# Patient Record
Sex: Female | Born: 1985 | Race: White | Hispanic: No | Marital: Single | Smoking: Never smoker
Health system: Southern US, Community
[De-identification: ages and names within clinical notes are randomized; demographics above are authoritative.]

## PROBLEM LIST (undated history)

## (undated) DIAGNOSIS — F419 Anxiety disorder, unspecified: Secondary | ICD-10-CM

## (undated) DIAGNOSIS — Z87442 Personal history of urinary calculi: Secondary | ICD-10-CM

## (undated) DIAGNOSIS — F411 Generalized anxiety disorder: Secondary | ICD-10-CM

## (undated) DIAGNOSIS — F329 Major depressive disorder, single episode, unspecified: Secondary | ICD-10-CM

## (undated) DIAGNOSIS — R7303 Prediabetes: Secondary | ICD-10-CM

## (undated) DIAGNOSIS — K5900 Constipation, unspecified: Secondary | ICD-10-CM

## (undated) DIAGNOSIS — K219 Gastro-esophageal reflux disease without esophagitis: Secondary | ICD-10-CM

## (undated) DIAGNOSIS — R9389 Abnormal findings on diagnostic imaging of other specified body structures: Secondary | ICD-10-CM

## (undated) DIAGNOSIS — D649 Anemia, unspecified: Secondary | ICD-10-CM

## (undated) DIAGNOSIS — N921 Excessive and frequent menstruation with irregular cycle: Secondary | ICD-10-CM

## (undated) DIAGNOSIS — N939 Abnormal uterine and vaginal bleeding, unspecified: Secondary | ICD-10-CM

## (undated) DIAGNOSIS — N201 Calculus of ureter: Secondary | ICD-10-CM

## (undated) DIAGNOSIS — F32A Depression, unspecified: Secondary | ICD-10-CM

## (undated) HISTORY — PX: WISDOM TOOTH EXTRACTION: SHX21

## (undated) HISTORY — DX: Abnormal uterine and vaginal bleeding, unspecified: N93.9

---
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
## 2010-06-29 ENCOUNTER — Emergency Department (HOSPITAL_COMMUNITY): Admission: EM | Admit: 2010-06-29 | Discharge: 2010-06-30 | Payer: Self-pay | Admitting: Emergency Medicine

## 2010-11-05 LAB — URINALYSIS, ROUTINE W REFLEX MICROSCOPIC
Ketones, ur: NEGATIVE mg/dL
Leukocytes, UA: NEGATIVE
Protein, ur: NEGATIVE mg/dL
Urobilinogen, UA: 0.2 mg/dL (ref 0.0–1.0)

## 2010-11-05 LAB — URINE MICROSCOPIC-ADD ON

## 2011-07-04 DIAGNOSIS — N926 Irregular menstruation, unspecified: Secondary | ICD-10-CM | POA: Insufficient documentation

## 2013-02-05 ENCOUNTER — Emergency Department (HOSPITAL_BASED_OUTPATIENT_CLINIC_OR_DEPARTMENT_OTHER)
Admission: EM | Admit: 2013-02-05 | Discharge: 2013-02-05 | Disposition: A | Discharge status: Home / Self Care | Payer: BC Managed Care – PPO | Attending: Emergency Medicine | Admitting: Emergency Medicine

## 2013-02-05 ENCOUNTER — Encounter (HOSPITAL_BASED_OUTPATIENT_CLINIC_OR_DEPARTMENT_OTHER): Payer: Self-pay | Admitting: *Deleted

## 2013-02-05 DIAGNOSIS — B379 Candidiasis, unspecified: Secondary | ICD-10-CM | POA: Insufficient documentation

## 2013-02-05 DIAGNOSIS — Z87448 Personal history of other diseases of urinary system: Secondary | ICD-10-CM | POA: Insufficient documentation

## 2013-02-05 DIAGNOSIS — B372 Candidiasis of skin and nail: Secondary | ICD-10-CM

## 2013-02-05 DIAGNOSIS — Z880 Allergy status to penicillin: Secondary | ICD-10-CM | POA: Insufficient documentation

## 2013-02-05 MED ORDER — NYSTATIN-TRIAMCINOLONE 100000-0.1 UNIT/GM-% EX OINT: TOPICAL_OINTMENT | Freq: Two times a day (BID) | CUTANEOUS | Status: DC

## 2013-02-05 NOTE — ED Notes (Signed)
Pt states the area above her naval was sore and began draining Thursday. She thought it may be a yeast infection and cleaned and tx'd it for same. Today began bleeding.

## 2013-02-05 NOTE — ED Provider Notes (Signed)
History     CSN: [id number]  Arrival date & time 02/05/13  1702   First MD Initiated Contact with Patient 02/05/13 1748      Chief Complaint  Patient presents with  . Wound Check    (Consider location/radiation/quality/duration/timing/severity/associated sxs/prior treatment) HPI Comments: Patient is a 27 year old female who presents with naval drainage for the past 2 days. Patient reports gradual onset of symptoms and progressive worsening. Patient reports white drainage from her naval yesterday and today she noticed some blood. Yesterday she cleaned the area but feels as though her symptoms are worsening. No associated symptoms. Palpation of the area is irritating. No alleviating factors.    Past Medical History  Diagnosis Date  . Renal disorder     History reviewed. No pertinent past surgical history.  History reviewed. No pertinent family history.  History  Substance Use Topics  . Smoking status: Never Smoker   . Smokeless tobacco: Not on file  . Alcohol Use: No    OB History   Grav Para Term Preterm Abortions TAB SAB Ect Mult Living                  Review of Systems  Skin: Positive for wound.  All other systems reviewed and are negative.    Allergies  Amoxicillin and Sulfa antibiotics  Home Medications   Current Outpatient Rx  Name  Route  Sig  Dispense  Refill  . phentermine 15 MG capsule   Oral   Take 15 mg by mouth every morning.           BP 120/76  Pulse 74  Temp(Src) 98.1 F (36.7 C) (Oral)  Resp 18  Ht 5\' 1"  (1.549 m)  Wt 185 lb (83.915 kg)  BMI 34.97 kg/m2  SpO2 100%  LMP 01/15/2013  Physical Exam  Nursing note and vitals reviewed. Constitutional: She appears well-developed and well-nourished. No distress.  HENT:  Head: Normocephalic and atraumatic.  Eyes: Conjunctivae are normal.  Neck: Normal range of motion.  Cardiovascular: Normal rate and regular rhythm.  Exam reveals no gallop and no friction rub.   No murmur  heard. Pulmonary/Chest: Effort normal and breath sounds normal. She has no wheezes. She has no rales. She exhibits no tenderness.  Abdominal: Soft. She exhibits no distension. There is no tenderness. There is no rebound and no guarding.  Musculoskeletal: Normal range of motion.  Neurological: She is alert.  Speech is goal-oriented. Moves limbs without ataxia.   Skin: Skin is warm and dry.  Erythematous and raw appearing naval area. White discharge noted in [employer].   Psychiatric: She has a normal mood and affect. Her behavior is normal.    ED Course  Procedures (including critical care time)  Labs Reviewed - No data to display No results found.   1. Yeast infection of the skin       MDM  6:24 PM Patient will have topical fluconazole for yeast infection. Patient instructed to keep area dry and clean. Patient will follow up with PCP. Patient instructed to return to the ED with worsening or concerning symptoms.        Emilia Beck, PA-C 02/06/13 1740

## 2013-02-06 NOTE — ED Provider Notes (Signed)
Medical screening examination/treatment/procedure(s) were performed by non-physician practitioner and as supervising physician I was immediately available for consultation/collaboration.   Karri Kallenbach B. Bernette Mayers, MD 02/06/13 [id number]

## 2013-04-25 ENCOUNTER — Emergency Department (HOSPITAL_COMMUNITY)
Admission: EM | Admit: 2013-04-25 | Discharge: 2013-04-25 | Disposition: A | Discharge status: Home / Self Care | Payer: BC Managed Care – PPO | Attending: Emergency Medicine | Admitting: Emergency Medicine

## 2013-04-25 ENCOUNTER — Encounter (HOSPITAL_COMMUNITY): Payer: Self-pay | Admitting: Emergency Medicine

## 2013-04-25 DIAGNOSIS — R110 Nausea: Secondary | ICD-10-CM | POA: Insufficient documentation

## 2013-04-25 DIAGNOSIS — Z79899 Other long term (current) drug therapy: Secondary | ICD-10-CM | POA: Insufficient documentation

## 2013-04-25 DIAGNOSIS — Z880 Allergy status to penicillin: Secondary | ICD-10-CM | POA: Insufficient documentation

## 2013-04-25 DIAGNOSIS — N23 Unspecified renal colic: Secondary | ICD-10-CM

## 2013-04-25 LAB — URINE MICROSCOPIC-ADD ON

## 2013-04-25 LAB — CBC WITH DIFFERENTIAL/PLATELET
Basophils Absolute: 0 10*3/uL (ref 0.0–0.1)
Basophils Relative: 0 % (ref 0–1)
Eosinophils Relative: 1 % (ref 0–5)
HCT: 42.7 % (ref 36.0–46.0)
Lymphocytes Relative: 13 % (ref 12–46)
MCHC: 33.7 g/dL (ref 30.0–36.0)
MCV: 93.6 fL (ref 78.0–100.0)
Monocytes Absolute: 1.2 10*3/uL — ABNORMAL HIGH (ref 0.1–1.0)
Platelets: 387 10*3/uL (ref 150–400)
RDW: 13.3 % (ref 11.5–15.5)
WBC: 12.9 10*3/uL — ABNORMAL HIGH (ref 4.0–10.5)

## 2013-04-25 LAB — URINALYSIS, ROUTINE W REFLEX MICROSCOPIC
Bilirubin Urine: NEGATIVE
Glucose, UA: NEGATIVE mg/dL
Ketones, ur: NEGATIVE mg/dL
Specific Gravity, Urine: 1.021 (ref 1.005–1.030)
pH: 6 (ref 5.0–8.0)

## 2013-04-25 LAB — BASIC METABOLIC PANEL
CO2: 28 mEq/L (ref 19–32)
Calcium: 9.6 mg/dL (ref 8.4–10.5)
Creatinine, Ser: 1.08 mg/dL (ref 0.50–1.10)
GFR calc Af Amer: 82 mL/min — ABNORMAL LOW (ref >90)
GFR calc non Af Amer: 71 mL/min — ABNORMAL LOW (ref >90)
Sodium: 137 mEq/L (ref 135–145)

## 2013-04-25 LAB — HCG, SERUM, QUALITATIVE: Preg, Serum: NEGATIVE

## 2013-04-25 MED ORDER — HYDROMORPHONE HCL PF 1 MG/ML IJ SOLN: 1.0000 mg | INTRAMUSCULAR | Status: DC | PRN

## 2013-04-25 MED ORDER — METRONIDAZOLE 500 MG PO TABS
500.0000 mg | ORAL_TABLET | Freq: Once | ORAL | Status: AC
  Administered 2013-04-25: 500 mg via ORAL
  Filled 2013-04-25: qty 1

## 2013-04-25 MED ORDER — OXYCODONE-ACETAMINOPHEN 5-325 MG PO TABS: 2.0000 | ORAL_TABLET | ORAL | Status: DC | PRN

## 2013-04-25 MED ORDER — SODIUM CHLORIDE 0.9 % IV BOLUS (SEPSIS)
1000.0000 mL | Freq: Once | INTRAVENOUS | Status: AC
  Administered 2013-04-25: 1000 mL via INTRAVENOUS

## 2013-04-25 MED ORDER — ONDANSETRON HCL 4 MG/2ML IJ SOLN
4.0000 mg | Freq: Once | INTRAMUSCULAR | Status: AC
  Administered 2013-04-25: 4 mg via INTRAVENOUS
  Filled 2013-04-25: qty 2

## 2013-04-25 MED ORDER — KETOROLAC TROMETHAMINE 30 MG/ML IJ SOLN
30.0000 mg | Freq: Once | INTRAMUSCULAR | Status: AC
  Administered 2013-04-25: 30 mg via INTRAVENOUS
  Filled 2013-04-25: qty 1

## 2013-04-25 NOTE — ED Notes (Signed)
Pt complains of left flank pain x 1 week. Pt reports to being seen and diagnosed with a kidney stone and :I'm suppose to schedule surgery" Pt reports that the pain and nausea are just "to bad to wait"

## 2013-04-25 NOTE — ED Notes (Deleted)
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 Pt denies pain or discomfort at this time.

## 2013-04-25 NOTE — ED Provider Notes (Signed)
CSN: [id number]     Arrival date & time 04/25/13  1139 History   First MD Initiated Contact with Patient 04/25/13 1155     Chief Complaint  Patient presents with  . Flank Pain    HPI   Patient has a history of kidney stones. Follows with urology, Dr. Marlou Porch.  Had a CT on Friday I tried imaging. She states that Dr. Marlou Porch was over it with her. She describes to me a proximal left 5 mm stone. She states that they discussed possibly doing surgery on Friday, 4 days from now.  She is here today stating that she feels the pain medicine is not controlled her pain well, and wonders if her surgery could be done sooner.  Nausea but no vomiting. No fever. Still making urine. Past Medical History  Diagnosis Date  . Renal disorder    History reviewed. No pertinent past surgical history. History reviewed. No pertinent family history. History  Substance Use Topics  . Smoking status: Never Smoker   . Smokeless tobacco: Not on file  . Alcohol Use: Yes   OB History   Grav Para Term Preterm Abortions TAB SAB Ect Mult Living                 Review of Systems  Constitutional: Negative for fever and chills.  Respiratory: Negative for cough and shortness of breath.   Cardiovascular: Negative for chest pain.  Gastrointestinal: Positive for nausea and abdominal pain. Negative for vomiting and diarrhea.  Endocrine: Negative for polydipsia, polyphagia and polyuria.  Genitourinary: Positive for dysuria and flank pain. Negative for frequency, hematuria and difficulty urinating.  Musculoskeletal: Negative for gait problem.  Skin: Negative for color change, pallor and rash.  Neurological: Negative for dizziness and light-headedness.    Allergies  Amoxicillin and Sulfa antibiotics  Home Medications   Current Outpatient Rx  Name  Route  Sig  Dispense  Refill  . ibuprofen (ADVIL,MOTRIN) 200 MG tablet   Oral   Take 800 mg by mouth every 6 (six) hours as needed for pain.         Marland Kitchen  oxyCODONE-acetaminophen (PERCOCET/ROXICET) 5-325 MG per tablet   Oral   Take 1 tablet by mouth every 6 (six) hours as needed for pain.         . phentermine 37.5 MG capsule   Oral   Take 37.5 mg by mouth every morning.         . promethazine (PHENERGAN) 25 MG tablet   Oral   Take 25-50 mg by mouth every 4 (four) hours as needed for nausea.         . tamsulosin (FLOMAX) 0.4 MG CAPS capsule   Oral   Take 0.4 mg by mouth every morning.         Marland Kitchen oxyCODONE-acetaminophen (PERCOCET/ROXICET) 5-325 MG per tablet   Oral   Take 2 tablets by mouth every 4 (four) hours as needed for pain.   20 tablet   0    BP 116/72  Pulse 89  Temp(Src) 98.4 F (36.9 C) (Oral)  Resp 16  SpO2 97% Physical Exam  Constitutional: She is oriented to person, place, and time. She appears well-developed and well-nourished. No distress.  Sitting upright. Speaking in full senses. She is not writhing  around. Rates her pain a 4-5/10.  HENT:  Head: Normocephalic.  Eyes: Conjunctivae are normal. Pupils are equal, round, and reactive to light. No scleral icterus.  Neck: Normal range of motion. Neck supple. No  thyromegaly present.  Cardiovascular: Normal rate and regular rhythm.  Exam reveals no gallop and no friction rub.   No murmur heard. Pulmonary/Chest: Effort normal and breath sounds normal. No respiratory distress. She has no wheezes. She has no rales.  Abdominal: Soft. Bowel sounds are normal. She exhibits no distension. There is no tenderness. There is no rebound.  Benign abdomen. Nontender.  Genitourinary:  Left flank pain. Not reproducible.  Musculoskeletal: Normal range of motion.  Neurological: She is alert and oriented to person, place, and time.  Skin: Skin is warm and dry. No rash noted.  Psychiatric: She has a normal mood and affect. Her behavior is normal.    ED Course  Procedures (including critical care time) Labs Review Labs Reviewed  URINALYSIS, ROUTINE W REFLEX MICROSCOPIC -  Abnormal; Notable for the following:    APPearance CLOUDY (*)    Hgb urine dipstick MODERATE (*)    Leukocytes, UA SMALL (*)    All other components within normal limits  CBC WITH DIFFERENTIAL - Abnormal; Notable for the following:    WBC 12.9 (*)    Neutrophils Relative % 78 (*)    Neutro Abs 10.0 (*)    Monocytes Absolute 1.2 (*)    All other components within normal limits  BASIC METABOLIC PANEL - Abnormal; Notable for the following:    GFR calc non Af Amer 71 (*)    GFR calc Af Amer 82 (*)    All other components within normal limits  URINE CULTURE  HCG, SERUM, QUALITATIVE  URINE MICROSCOPIC-ADD ON   Imaging Review No results found.  MDM   1. Ureteral colic    Dr. Renal function is preserved. She is tolerating this well. The pain is gone". She has an appointment to see her physician on Friday she is discharged    Claudean Kinds, MD 04/25/13 1415

## 2013-04-26 ENCOUNTER — Other Ambulatory Visit: Payer: Self-pay | Admitting: Urology

## 2013-04-26 LAB — URINE CULTURE
Colony Count: NO GROWTH
Culture: NO GROWTH

## 2013-04-28 ENCOUNTER — Encounter (HOSPITAL_BASED_OUTPATIENT_CLINIC_OR_DEPARTMENT_OTHER): Payer: Self-pay | Admitting: *Deleted

## 2013-04-28 NOTE — Progress Notes (Signed)
NPO AFTER MN. ARRIVES AT 1015. CURRENT LAB RESULTS IN EPIC AND CHART. WILL TAKE PRILOSEC AND IF NEEDED OXYCODONE/ PHENERGAN AM OF SURG W/ SIPS OF WATER.

## 2013-04-29 ENCOUNTER — Ambulatory Visit (HOSPITAL_BASED_OUTPATIENT_CLINIC_OR_DEPARTMENT_OTHER)
Admission: RE | Admit: 2013-04-29 | Discharge: 2013-04-29 | Disposition: A | Discharge status: Home / Self Care | Payer: BC Managed Care – PPO | Source: Ambulatory Visit | Attending: Urology | Admitting: Urology

## 2013-04-29 ENCOUNTER — Ambulatory Visit (HOSPITAL_COMMUNITY): Payer: BC Managed Care – PPO

## 2013-04-29 ENCOUNTER — Encounter (HOSPITAL_BASED_OUTPATIENT_CLINIC_OR_DEPARTMENT_OTHER): Payer: Self-pay | Admitting: Anesthesiology

## 2013-04-29 ENCOUNTER — Encounter (HOSPITAL_BASED_OUTPATIENT_CLINIC_OR_DEPARTMENT_OTHER)
Admission: RE | Disposition: A | Discharge status: Home / Self Care | Payer: Self-pay | Source: Ambulatory Visit | Attending: Urology

## 2013-04-29 ENCOUNTER — Ambulatory Visit (HOSPITAL_BASED_OUTPATIENT_CLINIC_OR_DEPARTMENT_OTHER): Payer: BC Managed Care – PPO | Admitting: Anesthesiology

## 2013-04-29 DIAGNOSIS — N200 Calculus of kidney: Secondary | ICD-10-CM | POA: Insufficient documentation

## 2013-04-29 DIAGNOSIS — K219 Gastro-esophageal reflux disease without esophagitis: Secondary | ICD-10-CM | POA: Insufficient documentation

## 2013-04-29 DIAGNOSIS — N201 Calculus of ureter: Secondary | ICD-10-CM | POA: Insufficient documentation

## 2013-04-29 DIAGNOSIS — Z79899 Other long term (current) drug therapy: Secondary | ICD-10-CM | POA: Insufficient documentation

## 2013-04-29 DIAGNOSIS — E669 Obesity, unspecified: Secondary | ICD-10-CM | POA: Insufficient documentation

## 2013-04-29 HISTORY — DX: Constipation, unspecified: K59.00

## 2013-04-29 HISTORY — DX: Gastro-esophageal reflux disease without esophagitis: K21.9

## 2013-04-29 HISTORY — PX: CYSTOSCOPY WITH RETROGRADE PYELOGRAM, URETEROSCOPY AND STENT PLACEMENT: SHX5789

## 2013-04-29 HISTORY — DX: Calculus of ureter: N20.1

## 2013-04-29 HISTORY — DX: Personal history of urinary calculi: Z87.442

## 2013-04-29 SURGERY — CYSTOURETEROSCOPY, WITH RETROGRADE PYELOGRAM AND STENT INSERTION
Anesthesia: General | Site: Ureter | Laterality: Left | Wound class: Clean Contaminated

## 2013-04-29 MED ORDER — IOHEXOL 350 MG/ML SOLN
INTRAVENOUS | Status: DC | PRN
  Administered 2013-04-29: 7 mL

## 2013-04-29 MED ORDER — LIDOCAINE HCL (CARDIAC) 20 MG/ML IV SOLN
INTRAVENOUS | Status: DC | PRN
  Administered 2013-04-29: 100 mg via INTRAVENOUS

## 2013-04-29 MED ORDER — OXYCODONE HCL 5 MG PO TABS: 5.0000 mg | ORAL_TABLET | ORAL | Status: DC | PRN

## 2013-04-29 MED ORDER — LIDOCAINE HCL 2 % EX GEL
CUTANEOUS | Status: DC | PRN
  Administered 2013-04-29: 1 via URETHRAL

## 2013-04-29 MED ORDER — SODIUM CHLORIDE 0.9 % IR SOLN
Status: DC | PRN
  Administered 2013-04-29: 6000 mL

## 2013-04-29 MED ORDER — KETOROLAC TROMETHAMINE 30 MG/ML IJ SOLN
INTRAMUSCULAR | Status: DC | PRN
  Administered 2013-04-29: 30 mg via INTRAVENOUS

## 2013-04-29 MED ORDER — FENTANYL CITRATE 0.05 MG/ML IJ SOLN
25.0000 ug | INTRAMUSCULAR | Status: DC | PRN
  Filled 2013-04-29: qty 1

## 2013-04-29 MED ORDER — TROSPIUM CHLORIDE 20 MG PO TABS: 20.0000 mg | ORAL_TABLET | Freq: Two times a day (BID) | ORAL | Status: DC

## 2013-04-29 MED ORDER — DEXAMETHASONE SODIUM PHOSPHATE 4 MG/ML IJ SOLN
INTRAMUSCULAR | Status: DC | PRN
  Administered 2013-04-29: 10 mg via INTRAVENOUS

## 2013-04-29 MED ORDER — ONDANSETRON HCL 4 MG/2ML IJ SOLN
INTRAMUSCULAR | Status: DC | PRN
  Administered 2013-04-29: 4 mg via INTRAVENOUS

## 2013-04-29 MED ORDER — BELLADONNA ALKALOIDS-OPIUM 16.2-60 MG RE SUPP
RECTAL | Status: DC | PRN
  Administered 2013-04-29: 1 via RECTAL

## 2013-04-29 MED ORDER — FENTANYL CITRATE 0.05 MG/ML IJ SOLN
INTRAMUSCULAR | Status: DC | PRN
  Administered 2013-04-29 (×6): 50 ug via INTRAVENOUS

## 2013-04-29 MED ORDER — LACTATED RINGERS IV SOLN
INTRAVENOUS | Status: DC
  Administered 2013-04-29 (×2): via INTRAVENOUS
  Filled 2013-04-29: qty 1000

## 2013-04-29 MED ORDER — MIDAZOLAM HCL 5 MG/5ML IJ SOLN: INTRAMUSCULAR | Status: DC | PRN

## 2013-04-29 MED ORDER — CIPROFLOXACIN IN D5W 400 MG/200ML IV SOLN
400.0000 mg | INTRAVENOUS | Status: AC
  Administered 2013-04-29: 400 mg via INTRAVENOUS
  Filled 2013-04-29: qty 200

## 2013-04-29 MED ORDER — PROPOFOL 10 MG/ML IV BOLUS
INTRAVENOUS | Status: DC | PRN
  Administered 2013-04-29: 200 mg via INTRAVENOUS

## 2013-04-29 MED ORDER — LACTATED RINGERS IV SOLN
INTRAVENOUS | Status: DC
  Filled 2013-04-29: qty 1000

## 2013-04-29 MED ORDER — METOCLOPRAMIDE HCL 5 MG/ML IJ SOLN
INTRAMUSCULAR | Status: DC | PRN
  Administered 2013-04-29: 10 mg via INTRAVENOUS

## 2013-04-29 MED ORDER — MIDAZOLAM HCL 5 MG/5ML IJ SOLN
INTRAMUSCULAR | Status: DC | PRN
  Administered 2013-04-29: 2 mg via INTRAVENOUS

## 2013-04-29 SURGICAL SUPPLY — 36 items
ADAPTER CATH URET PLST 4-6FR (CATHETERS) IMPLANT
BAG DRAIN URO-CYSTO SKYTR STRL (DRAIN) ×3 IMPLANT
BASKET LASER NITINOL 1.9FR (BASKET) IMPLANT
BASKET STNLS GEMINI 4WIRE 3FR (BASKET) IMPLANT
BASKET ZERO TIP NITINOL 2.4FR (BASKET) IMPLANT
CANISTER SUCT LVC 12 LTR MEDI- (MISCELLANEOUS) ×3 IMPLANT
CATH INTERMIT  6FR 70CM (CATHETERS) IMPLANT
CATH URET 5FR 28IN CONE TIP (BALLOONS)
CATH URET 5FR 28IN OPEN ENDED (CATHETERS) ×3 IMPLANT
CATH URET 5FR 70CM CONE TIP (BALLOONS) IMPLANT
CATH URET DUAL LUMEN 6-10FR 50 (CATHETERS) ×3 IMPLANT
CLOTH BEACON ORANGE TIMEOUT ST (SAFETY) ×3 IMPLANT
DRAPE CAMERA CLOSED 9X96 (DRAPES) ×3 IMPLANT
DRSG TEGADERM 2-3/8X2-3/4 SM (GAUZE/BANDAGES/DRESSINGS) IMPLANT
GLOVE BIO SURGEON STRL SZ7.5 (GLOVE) ×3 IMPLANT
GLOVE BIOGEL PI IND STRL 6.5 (GLOVE) ×4 IMPLANT
GLOVE BIOGEL PI INDICATOR 6.5 (GLOVE) ×2
GLOVE ECLIPSE 6.5 STRL STRAW (GLOVE) ×3 IMPLANT
GOWN STRL NON-REIN LRG LVL3 (GOWN DISPOSABLE) ×3 IMPLANT
GOWN STRL REIN XL XLG (GOWN DISPOSABLE) ×3 IMPLANT
GUIDEWIRE 0.038 PTFE COATED (WIRE) IMPLANT
GUIDEWIRE ANG ZIPWIRE 038X150 (WIRE) IMPLANT
GUIDEWIRE STR DUAL SENSOR (WIRE) ×6 IMPLANT
IV NS IRRIG 3000ML ARTHROMATIC (IV SOLUTION) ×6 IMPLANT
KIT BALLIN UROMAX 15FX10 (LABEL) ×2 IMPLANT
KIT BALLN UROMAX 15FX4 (MISCELLANEOUS) IMPLANT
KIT BALLN UROMAX 26 75X4 (MISCELLANEOUS)
LASER FIBER DISP (UROLOGICAL SUPPLIES) IMPLANT
NS IRRIG 500ML POUR BTL (IV SOLUTION) IMPLANT
PACK CYSTOSCOPY (CUSTOM PROCEDURE TRAY) ×3 IMPLANT
SET HIGH PRES BAL DIL (LABEL) ×1
SHEATH ACCESS URETERAL 24CM (SHEATH) ×3 IMPLANT
SHEATH ACCESS URETERAL 38CM (SHEATH) IMPLANT
SHEATH ACCESS URETERAL 54CM (SHEATH) IMPLANT
STENT CONTOUR 8FR X 24 (STENTS) ×3 IMPLANT
TUBE FEEDING 8FR 16IN STR KANG (MISCELLANEOUS) ×3 IMPLANT

## 2013-04-29 NOTE — Anesthesia Postprocedure Evaluation (Signed)
  Anesthesia Post-op Note  Patient: Sarah Fuller  Procedure(s) Performed: Procedure(s) (LRB): LEFT URETEROSCOPY, LEFT DIGITAL URETEROSCOPY, LEFT RETROGRADE PYELOGRAM, LEFT URETER BALLOON DILATATION AND LEFT URETERAL STENT PLACEMENT (Left)  Patient Location: PACU  Anesthesia Type: General  Level of Consciousness: awake and alert   Airway and Oxygen Therapy: Patient Spontanous Breathing  Post-op Pain: mild  Post-op Assessment: Post-op Vital signs reviewed, Patient's Cardiovascular Status Stable, Respiratory Function Stable, Patent Airway and No signs of Nausea or vomiting  Last Vitals:  Filed Vitals:   04/29/13 1400  BP: 113/79  Pulse: 77  Temp:   Resp: 20    Post-op Vital Signs: stable   Complications: No apparent anesthesia complications

## 2013-04-29 NOTE — Anesthesia Procedure Notes (Signed)
Procedure Name: LMA Insertion Date/Time: 04/29/2013 11:50 AM Performed by: Norva Pavlov Pre-anesthesia Checklist: Patient identified, Emergency Drugs available, Suction available and Patient being monitored Patient Re-evaluated:Patient Re-evaluated prior to inductionOxygen Delivery Method: Circle System Utilized Preoxygenation: Pre-oxygenation with 100% oxygen Intubation Type: IV induction Ventilation: Mask ventilation without difficulty LMA: LMA inserted LMA Size: 4.0 Number of attempts: 1 Airway Equipment and Method: bite block Placement Confirmation: positive ETCO2 Tube secured with: Tape Dental Injury: Teeth and Oropharynx as per pre-operative assessment

## 2013-04-29 NOTE — Transfer of Care (Signed)
Immediate Anesthesia Transfer of Care Note  Patient: Sarah Fuller  Procedure(s) Performed: Procedure(s) (LRB): LEFT URETEROSCOPY, LEFT DIGITAL URETEROSCOPY, LEFT RETROGRADE PYELOGRAM, LEFT URETER BALLOON DILATATION AND LEFT URETERAL STENT PLACEMENT (Left)  Patient Location: PACU  Anesthesia Type: General  Level of Consciousness: awake, alert  and oriented  Airway & Oxygen Therapy: Patient Spontanous Breathing and Patient connected to face mask oxygen  Post-op Assessment: Report given to PACU RN and Post -op Vital signs reviewed and stable  Post vital signs: Reviewed and stable  Complications: No apparent anesthesia complications

## 2013-04-29 NOTE — Op Note (Signed)
Preoperative diagnosis: left ureteral calculus  Postoperative diagnosis: left ureteral calculus  Procedure:  1. Cystoscopy 2. Left Ureteral balloon dilation 3. left 75F x 24cm ureteral stent placement  4. left retrograde pyelography with interpretation  Surgeon: Crist Fat, MD  Anesthesia: General  Complications: None  Intraoperative findings: left retrograde pyelography demonstrated a filling defect within the left ureter consistent with the patient's known calculus without other abnormalities.  Distal ureter was very narrow and would not accommodate an 12/14 access sheath, even after balloon dilating it using a 569Fx 10cm Uromax balloon.  I was able to pass the rigid scope up to the renal pelvis, but the ureter would not accommodate the flexible ureteroscope.  EBL: Minimal  Specimens: 1. none  Indication: Sarah Fuller is a 27 y.o.   patient with urolithiasis. After reviewing the management options for treatment, the patient elected to proceed with the above surgical procedure(s). We have discussed the potential benefits and risks of the procedure, side effects of the proposed treatment, the likelihood of the patient achieving the goals of the procedure, and any potential problems that might occur during the procedure or recuperation. Informed consent has been obtained.  Description of procedure:  The patient was taken to the operating room and general anesthesia was induced.  The patient was placed in the dorsal lithotomy position, prepped and draped in the usual sterile fashion, and preoperative antibiotics were administered. A preoperative time-out was performed.   Cystourethroscopy was performed.  The patient's urethra was examined and was norma. The bladder was then systematically examined in its entirety. There was no evidence for any bladder tumors, stones, or other mucosal pathology.    Attention then turned to the left ureteral orifice and a ureteral catheter was  used to intubate the ureteral orifice.  Omnipaque contrast was injected through the ureteral catheter and a retrograde pyelogram was performed with findings as dictated above.  A 0.38 sensor guidewire was then advanced up the left ureter into the renal pelvis under fluoroscopic guidance. I then needed a second guide wire to help advance the rigid scope through the left UO.  The scope would not advance passed the pelvic brim.  I then used a dual lumen catheter to dilate the ureter and was then able to get the rigid scope into the proximal ureter .  At this point the ureteral stone had been pushed into the renal pelvis.  I then removed the rigid scope and tried passing the digital flexible ureteroscope which was not able to get beyond the intramural ureter.  We then used a 569F uromax balloon ureteral dilation kit and dilated the distal ureter/UO.  We then passed a 12/14 25cm ureteral access sheath into the distal ureter and again unsuccessfully attempted to pass the digital flexible ureteroscope into the proximal ureter.  We then tried the analog flexible ureteroscope which was also unsuccessful.  Having unsuccessfully attempted to pass the flexible scope into the renal pelvis, we opted to leave an 75F x 24cm JJ stent and allow things to dilate up over time before attempting again.  The safety wire was then backloaded through the cystoscope and a ureteral stent was advance over the wire using Seldinger technique.  The stent was positioned appropriately under fluoroscopic and cystoscopic guidance.  The wire was then removed with an adequate stent curl noted in the renal pelvis as well as in the bladder.  The bladder was then emptied and the procedure ended.  The patient appeared to tolerate the procedure  well and without complications.  The patient was able to be awakened and transferred to the recovery unit in satisfactory condition.

## 2013-04-29 NOTE — Anesthesia Preprocedure Evaluation (Addendum)
Anesthesia Evaluation  Patient identified by MRN, date of birth, ID band Patient awake    Reviewed: Allergy & Precautions, H&P , NPO status , Patient's Chart, lab work & pertinent test results  Airway Mallampati: II TM Distance: >3 FB Neck ROM: full    Dental no notable dental hx. (+) Teeth Intact and Dental Advisory Given   Pulmonary neg pulmonary ROS,  breath sounds clear to auscultation  Pulmonary exam normal       Cardiovascular Exercise Tolerance: Good negative cardio ROS  Rhythm:regular Rate:Normal     Neuro/Psych negative neurological ROS  negative psych ROS   GI/Hepatic negative GI ROS, Neg liver ROS, GERD-  Medicated and Controlled,  Endo/Other  negative endocrine ROS  Renal/GU negative Renal ROS  negative genitourinary   Musculoskeletal   Abdominal (+) + obese,   Peds  Hematology negative hematology ROS (+)   Anesthesia Other Findings   Reproductive/Obstetrics negative OB ROS                          Anesthesia Physical Anesthesia Plan  ASA: II  Anesthesia Plan: General   Post-op Pain Management:    Induction: Intravenous  Airway Management Planned: LMA  Additional Equipment:   Intra-op Plan:   Post-operative Plan:   Informed Consent: I have reviewed the patients History and Physical, chart, labs and discussed the procedure including the risks, benefits and alternatives for the proposed anesthesia with the patient or authorized representative who has indicated his/her understanding and acceptance.   Dental Advisory Given  Plan Discussed with: CRNA and Surgeon  Anesthesia Plan Comments:        Anesthesia Quick Evaluation

## 2013-04-29 NOTE — H&P (Signed)
Active Problems Problems  1. Hematuria 599.70 2. Nephrolithiasis 592.0  History of Present Illness     Sarah Fuller is a 27-year-old female who is self-referred for evaluation of hematuria and kidney stones. Pain began five days prior with intense intermittent flank pain.  Yesterday the pain was so intense he sought medical attention in the local ED.  CT scan was obtained revealing a 5 mm left UPJ stone with 5 smaller stone fragments scattered throughout the kidney.  She did have proximal hydro-.  In addition she had small ditzel stones on the left side.  The patient was given Flomax and Percocet discharged home with close follow-up with urology. Today she presents with pain relatively well controlled but taking Percocet around-the-clock.  She denies any fever but does endorse flushing, nausea, and small amounts of hematuria.  She has not had any changes in her voiding symptoms.  She denies abdominal or suprapubic pain.  The patient thinks that she has passed the stone in the past but has never had any urologic intervention.  She has a grandmother who has a history of kidney stones.   Past Medical History Problems  1. History of  Blood In Urine 599.70 2. History of  Lower Back Pain 724.2 3. History of  Nephrolithiasis V13.01  Surgical History Problems  1. History of  Oral Surgery Tooth Extraction  Current Meds 1. Omeprazole 20 MG Oral Tablet Delayed Release; Therapy: (Recorded:29Aug2014) to 2. Promethazine HCl 25 MG Oral Tablet; Therapy: (Recorded:29Aug2014) to 3. Roxicet 5-325 MG Oral Tablet; Therapy: (Recorded:29Aug2014) to 4. Tamsulosin HCl 0.4 MG Oral Capsule; Therapy: (Recorded:29Aug2014) to  Allergies Medication  1. Amoxicillin TABS 2. Sulfa Drugs  Family History Problems  1. Maternal grandmother's history of  Congestive Heart Failure 2. Maternal history of  Osteoarthritis V17.7 3. Family history of  Urinary Calculus  Social History Problems    Alcohol Use occassionally    Caffeine Use 2 to 3 servings of sweet tea   Marital History - Single   Never A Smoker  Review of Systems     A 12 point compressive review of systems was obtained and is negative unless stated in the HPI.     Vitals  29August2014 Blood Pressure: 120 / 83 Temperature: 98.3 F Heart Rate: 83 BMI Calculated: 33.79 BSA Calculated: 1.8 Height: 5 ft 1 in Weight: 179 lb   Physical Exam Constitutional: Well nourished and well developed . No acute distress.  ENT:. The ears and nose are normal in appearance.  Neck: The appearance of the neck is normal and no neck mass is present.  Pulmonary: No respiratory distress, normal respiratory rhythm and effort and clear bilateral breath sounds.  Cardiovascular: Heart rate and rhythm are normal . The arterial pulses are normal. No peripheral edema.  Abdomen: The abdomen is rounded, but not distended. The abdomen is soft and nontender. No right CVA tenderness and left CVA tenderness no CVA tenderness. No hernias are palpable. No hepatosplenomegaly noted.  Skin: Normal skin turgor, no visible rash and no visible skin lesions.  Neuro/Psych:. Mood and affect are appropriate.    Results/Data     Patient had a CT scan noncontrast abdomen and pelvis performed on 04/22/2012 which I personally reviewed which shows a 5 mm left UPJ stone with proximal hydronephrosis and 5 small stones scattered throughout the left renal pelvis.  In addition the patient had several small stone fragments in the right kidney without hydronephrosis.  The remainder of the patient's CAT scan is normal       Assessment     Left obstructing UPJ stone with several stone fragments in her left renal pelvis, patient's pain seems to be reasonably well controlled and she has no evidence of infection.   Plan  Nephrolithiasis (592.0)  1. OxyCODONE HCl 5 MG Oral Tablet; TAKE 1 TABLET EVERY 4 HOURS AS NEEDED FOR PAIN;  Therapy: 29Aug2014 to (Evaluate:06Sep2014); Last Rx:29Aug2014       We discussed the management of her stone great detail, including medical portion therapy, shockwave lithotripsy, and ureteroscopy.  I detailed to her the risk and benefits of each however I don't think she is likely to pass this stone anytime soon given that it still at the UPJ.  I offered shockwave lithotripsy 6 days from now which is the earliest we have available, and ureteroscopy the following day.  We discussed the discomfort was a stenting and the fact that ureteroscopy requires postoperative stent.  She knows this is the disadvantage over shockwave lithotripsy but the stone free rate is higher.  I also explained to her that with ureteroscopy we could try and remove all her stone fragments.  Patient has opted for ureteroscopy.  I detailed this operation for her in detail including the risk and benefits of the operation.  She understands she'll need a stent postoperatively.  She also understands that if I'm unable to access the kidney  endoscopically initially she will require a stent, which will allow the ureter to dilate up prior to ureteroscopic removal of her stone.  We tentatively decided on next Friday.  In the meantime I really prescription for oxycodone.  In addition I outlined a pain regimen including 1 g of Tylenol every 6 hours, ibuprofen 800 mg every 8 hours, and 2.5-5 mg of oxycodone when necessary.  In addition the patient will continue to take Flomax.  She knows to call if she has or develops any fevers or passes the stone. 

## 2013-05-03 ENCOUNTER — Encounter (HOSPITAL_BASED_OUTPATIENT_CLINIC_OR_DEPARTMENT_OTHER): Payer: Self-pay | Admitting: Urology

## 2013-05-04 ENCOUNTER — Other Ambulatory Visit: Payer: Self-pay | Admitting: Urology

## 2013-05-05 ENCOUNTER — Encounter (HOSPITAL_BASED_OUTPATIENT_CLINIC_OR_DEPARTMENT_OTHER): Payer: Self-pay | Admitting: *Deleted

## 2013-05-05 NOTE — Progress Notes (Signed)
NPO AFTER MN. ARRIVES AT 0945. NEEDS HG AND URINE PREG. WILL TAKE PRILOSEC AM OF SURG W/ SIP OF WATER.

## 2013-05-12 NOTE — Anesthesia Preprocedure Evaluation (Addendum)
Anesthesia Evaluation  Patient identified by MRN, date of birth, ID band Patient awake    Reviewed: Allergy & Precautions, H&P , NPO status , Patient's Chart, lab work & pertinent test results  Airway Mallampati: II TM Distance: >3 FB Neck ROM: full    Dental no notable dental hx. (+) Teeth Intact and Dental Advisory Given   Pulmonary neg pulmonary ROS,  breath sounds clear to auscultation  Pulmonary exam normal       Cardiovascular Exercise Tolerance: Good negative cardio ROS  Rhythm:regular Rate:Normal     Neuro/Psych negative neurological ROS  negative psych ROS   GI/Hepatic negative GI ROS, Neg liver ROS, GERD-  Medicated and Controlled,  Endo/Other  negative endocrine ROS  Renal/GU negative Renal ROS     Musculoskeletal   Abdominal (+) + obese,   Peds  Hematology negative hematology ROS (+)   Anesthesia Other Findings   Reproductive/Obstetrics negative OB ROS                          Anesthesia Physical  Anesthesia Plan  ASA: II  Anesthesia Plan: General   Post-op Pain Management:    Induction: Intravenous  Airway Management Planned: LMA  Additional Equipment:   Intra-op Plan:   Post-operative Plan: Extubation in OR  Informed Consent: I have reviewed the patients History and Physical, chart, labs and discussed the procedure including the risks, benefits and alternatives for the proposed anesthesia with the patient or authorized representative who has indicated his/her understanding and acceptance.   Dental advisory given  Plan Discussed with: Surgeon and CRNA  Anesthesia Plan Comments:        Anesthesia Quick Evaluation

## 2013-05-13 ENCOUNTER — Ambulatory Visit (HOSPITAL_BASED_OUTPATIENT_CLINIC_OR_DEPARTMENT_OTHER)
Admission: RE | Admit: 2013-05-13 | Discharge: 2013-05-13 | Disposition: A | Discharge status: Home / Self Care | Payer: BC Managed Care – PPO | Source: Ambulatory Visit | Attending: Urology | Admitting: Urology

## 2013-05-13 ENCOUNTER — Ambulatory Visit (HOSPITAL_BASED_OUTPATIENT_CLINIC_OR_DEPARTMENT_OTHER): Payer: BC Managed Care – PPO | Admitting: Anesthesiology

## 2013-05-13 ENCOUNTER — Encounter (HOSPITAL_BASED_OUTPATIENT_CLINIC_OR_DEPARTMENT_OTHER): Payer: Self-pay | Admitting: Anesthesiology

## 2013-05-13 ENCOUNTER — Encounter (HOSPITAL_BASED_OUTPATIENT_CLINIC_OR_DEPARTMENT_OTHER): Payer: Self-pay | Admitting: *Deleted

## 2013-05-13 ENCOUNTER — Ambulatory Visit (HOSPITAL_COMMUNITY): Payer: BC Managed Care – PPO

## 2013-05-13 ENCOUNTER — Encounter (HOSPITAL_BASED_OUTPATIENT_CLINIC_OR_DEPARTMENT_OTHER)
Admission: RE | Disposition: A | Discharge status: Home / Self Care | Payer: Self-pay | Source: Ambulatory Visit | Attending: Urology

## 2013-05-13 DIAGNOSIS — N200 Calculus of kidney: Secondary | ICD-10-CM | POA: Insufficient documentation

## 2013-05-13 DIAGNOSIS — N209 Urinary calculus, unspecified: Secondary | ICD-10-CM

## 2013-05-13 DIAGNOSIS — R319 Hematuria, unspecified: Secondary | ICD-10-CM | POA: Insufficient documentation

## 2013-05-13 DIAGNOSIS — E669 Obesity, unspecified: Secondary | ICD-10-CM | POA: Insufficient documentation

## 2013-05-13 DIAGNOSIS — Z79899 Other long term (current) drug therapy: Secondary | ICD-10-CM | POA: Insufficient documentation

## 2013-05-13 DIAGNOSIS — K219 Gastro-esophageal reflux disease without esophagitis: Secondary | ICD-10-CM | POA: Insufficient documentation

## 2013-05-13 DIAGNOSIS — N201 Calculus of ureter: Secondary | ICD-10-CM | POA: Insufficient documentation

## 2013-05-13 DIAGNOSIS — N1330 Unspecified hydronephrosis: Secondary | ICD-10-CM | POA: Insufficient documentation

## 2013-05-13 HISTORY — PX: HOLMIUM LASER APPLICATION: SHX5852

## 2013-05-13 HISTORY — PX: CYSTOSCOPY W/ URETERAL STENT PLACEMENT: SHX1429

## 2013-05-13 HISTORY — PX: CYSTOSCOPY/RETROGRADE/URETEROSCOPY: SHX5316

## 2013-05-13 LAB — POCT HEMOGLOBIN-HEMACUE: Hemoglobin: 16.4 g/dL — ABNORMAL HIGH (ref 12.0–15.0)

## 2013-05-13 LAB — POCT PREGNANCY, URINE: Preg Test, Ur: NEGATIVE

## 2013-05-13 SURGERY — CYSTOSCOPY/RETROGRADE/URETEROSCOPY
Anesthesia: General | Site: Ureter | Laterality: Left

## 2013-05-13 MED ORDER — METOCLOPRAMIDE HCL 5 MG/ML IJ SOLN
INTRAMUSCULAR | Status: DC | PRN
  Administered 2013-05-13: 10 mg via INTRAVENOUS

## 2013-05-13 MED ORDER — KETOROLAC TROMETHAMINE 30 MG/ML IJ SOLN
INTRAMUSCULAR | Status: DC | PRN
  Administered 2013-05-13: 30 mg via INTRAVENOUS

## 2013-05-13 MED ORDER — OXYCODONE HCL 5 MG PO TABS
5.0000 mg | ORAL_TABLET | Freq: Once | ORAL | Status: AC | PRN
  Administered 2013-05-13: 5 mg via ORAL
  Filled 2013-05-13: qty 1

## 2013-05-13 MED ORDER — HYDROMORPHONE HCL PF 1 MG/ML IJ SOLN
0.2500 mg | INTRAMUSCULAR | Status: DC | PRN
  Filled 2013-05-13: qty 1

## 2013-05-13 MED ORDER — CIPROFLOXACIN IN D5W 400 MG/200ML IV SOLN
400.0000 mg | INTRAVENOUS | Status: AC
  Administered 2013-05-13: 400 mg via INTRAVENOUS
  Filled 2013-05-13: qty 200

## 2013-05-13 MED ORDER — OXYCODONE HCL 5 MG/5ML PO SOLN
5.0000 mg | Freq: Once | ORAL | Status: AC | PRN
  Filled 2013-05-13: qty 5

## 2013-05-13 MED ORDER — MEPERIDINE HCL 25 MG/ML IJ SOLN
6.2500 mg | INTRAMUSCULAR | Status: DC | PRN
  Filled 2013-05-13: qty 1

## 2013-05-13 MED ORDER — BELLADONNA ALKALOIDS-OPIUM 16.2-60 MG RE SUPP
RECTAL | Status: DC | PRN
  Administered 2013-05-13: 1 via RECTAL

## 2013-05-13 MED ORDER — LACTATED RINGERS IV SOLN
INTRAVENOUS | Status: DC
  Administered 2013-05-13 (×2): via INTRAVENOUS
  Filled 2013-05-13: qty 1000

## 2013-05-13 MED ORDER — MIDAZOLAM HCL 5 MG/5ML IJ SOLN
INTRAMUSCULAR | Status: DC | PRN
  Administered 2013-05-13: 2 mg via INTRAVENOUS

## 2013-05-13 MED ORDER — CIPROFLOXACIN HCL 500 MG PO TABS: 500.0000 mg | ORAL_TABLET | Freq: Two times a day (BID) | ORAL | Status: DC

## 2013-05-13 MED ORDER — DEXAMETHASONE SODIUM PHOSPHATE 4 MG/ML IJ SOLN
INTRAMUSCULAR | Status: DC | PRN
  Administered 2013-05-13: 10 mg via INTRAVENOUS

## 2013-05-13 MED ORDER — PROPOFOL 10 MG/ML IV BOLUS
INTRAVENOUS | Status: DC | PRN
  Administered 2013-05-13: 200 mg via INTRAVENOUS

## 2013-05-13 MED ORDER — PROMETHAZINE HCL 25 MG/ML IJ SOLN
6.2500 mg | INTRAMUSCULAR | Status: DC | PRN
  Filled 2013-05-13: qty 1

## 2013-05-13 MED ORDER — FENTANYL CITRATE 0.05 MG/ML IJ SOLN
INTRAMUSCULAR | Status: DC | PRN
  Administered 2013-05-13 (×6): 50 ug via INTRAVENOUS

## 2013-05-13 MED ORDER — ONDANSETRON HCL 4 MG/2ML IJ SOLN
INTRAMUSCULAR | Status: DC | PRN
  Administered 2013-05-13: 4 mg via INTRAVENOUS

## 2013-05-13 MED ORDER — IOHEXOL 350 MG/ML SOLN
INTRAVENOUS | Status: DC | PRN
  Administered 2013-05-13: 30 mL

## 2013-05-13 MED ORDER — SODIUM CHLORIDE 0.9 % IR SOLN
Status: DC | PRN
  Administered 2013-05-13: 9000 mL

## 2013-05-13 MED ORDER — LIDOCAINE HCL (CARDIAC) 20 MG/ML IV SOLN
INTRAVENOUS | Status: DC | PRN
  Administered 2013-05-13: 80 mg via INTRAVENOUS

## 2013-05-13 SURGICAL SUPPLY — 34 items
ADAPTER CATH URET PLST 4-6FR (CATHETERS) IMPLANT
BAG DRAIN URO-CYSTO SKYTR STRL (DRAIN) ×2 IMPLANT
BASKET LASER NITINOL 1.9FR (BASKET) ×2 IMPLANT
BASKET STNLS GEMINI 4WIRE 3FR (BASKET) IMPLANT
BASKET ZERO TIP NITINOL 2.4FR (BASKET) ×2 IMPLANT
CANISTER SUCT LVC 12 LTR MEDI- (MISCELLANEOUS) ×2 IMPLANT
CATH INTERMIT  6FR 70CM (CATHETERS) IMPLANT
CATH URET 5FR 28IN CONE TIP (BALLOONS)
CATH URET 5FR 28IN OPEN ENDED (CATHETERS) ×2 IMPLANT
CATH URET 5FR 70CM CONE TIP (BALLOONS) IMPLANT
CATH URET DUAL LUMEN 6-10FR 50 (CATHETERS) IMPLANT
CLOTH BEACON ORANGE TIMEOUT ST (SAFETY) ×2 IMPLANT
DRAPE CAMERA CLOSED 9X96 (DRAPES) ×2 IMPLANT
DRSG TEGADERM 2-3/8X2-3/4 SM (GAUZE/BANDAGES/DRESSINGS) IMPLANT
GLOVE BIO SURGEON STRL SZ7.5 (GLOVE) ×2 IMPLANT
GLOVE BIOGEL PI IND STRL 6.5 (GLOVE) ×1 IMPLANT
GLOVE BIOGEL PI INDICATOR 6.5 (GLOVE) ×1
GLOVE ECLIPSE 6.5 STRL STRAW (GLOVE) ×2 IMPLANT
GOWN STRL NON-REIN LRG LVL3 (GOWN DISPOSABLE) ×2 IMPLANT
GOWN STRL REIN XL XLG (GOWN DISPOSABLE) ×2 IMPLANT
GUIDEWIRE 0.038 PTFE COATED (WIRE) IMPLANT
GUIDEWIRE ANG ZIPWIRE 038X150 (WIRE) ×2 IMPLANT
GUIDEWIRE STR DUAL SENSOR (WIRE) ×2 IMPLANT
IV NS IRRIG 3000ML ARTHROMATIC (IV SOLUTION) ×4 IMPLANT
KIT BALLIN UROMAX 15FX10 (LABEL) IMPLANT
KIT BALLN UROMAX 15FX4 (MISCELLANEOUS) IMPLANT
KIT BALLN UROMAX 26 75X4 (MISCELLANEOUS)
LASER FIBER DISP (UROLOGICAL SUPPLIES) ×2 IMPLANT
NS IRRIG 500ML POUR BTL (IV SOLUTION) IMPLANT
PACK CYSTOSCOPY (CUSTOM PROCEDURE TRAY) ×2 IMPLANT
SET HIGH PRES BAL DIL (LABEL)
SHEATH ACCESS URETERAL 38CM (SHEATH) ×2 IMPLANT
SHEATH ACCESS URETERAL 54CM (SHEATH) IMPLANT
TUBE FEEDING 8FR 16IN STR KANG (MISCELLANEOUS) IMPLANT

## 2013-05-13 NOTE — Anesthesia Postprocedure Evaluation (Signed)
  Anesthesia Post-op Note  Patient: Sarah Fuller  Procedure(s) Performed: Procedure(s) (LRB): CYSTOSCOPY/RETROGRADE/URETEROSCOPY (Left) CYSTOSCOPY WITH STENT REPLACEMENT (Left) HOLMIUM LASER APPLICATION (Left)  Patient Location: PACU  Anesthesia Type: General  Level of Consciousness: awake and alert   Airway and Oxygen Therapy: Patient Spontanous Breathing  Post-op Pain: mild  Post-op Assessment: Post-op Vital signs reviewed, Patient's Cardiovascular Status Stable, Respiratory Function Stable, Patent Airway and No signs of Nausea or vomiting  Last Vitals:  Filed Vitals:   05/13/13 1400  BP: 119/75  Pulse: 81  Temp:   Resp: 17    Post-op Vital Signs: stable   Complications: No apparent anesthesia complications

## 2013-05-13 NOTE — Op Note (Signed)
Preoperative diagnosis: Left urolithiasis  Postoperative diagnosis same  Procedure performed: #1 cystoscopy #2 left retrograde pyelogram with interpretation #3 left ureteroscopy/laser lithotripsy and stone removal #4 left ureteral stent exchange.  Surgeon: Dr. Crist Fat  Findings: Retrograde was performed revealing a clear ureteral filling defect with several smaller defects in the left renal pelvis. In addition the stent appeared to extend through the collecting system the upper pole, and  there was contrast extravasation. The stent was well-positioned at the end of the case. All stone was in fragments were removed.  Indications: Sarah Fuller is a 27 year old female with a history of urolithiasis. She presented to the emergency department several weeks ago with left ureteral obstructing stone and renal colic. She underwent a left ureteral stent and presents today for more definitive therapy including stone removal.  Description of the procedure: Consent was obtained the preoperative holding area. The patient was then brought back to the operating room and placed on the table in supine position. Gen. anesthesia was then induced and endotracheal tube inserted. She was then placed in dorsolithotomy position and prepped and draped in the routine sterile fashion. A timeout was then held. Using the 22 French cystoscope with a 30 lens I gently inserted into the patient's urethra and into the bladder. I then emptied the bladder and using a stent grasper grasped the stent and pull it to the urethral meatus. I then wired the stent with a 0.038 Sensor wire wouldn't pass through the stent into the left renal pelvis, and remove this stent over the wire. Evidence that this wire to the drape. I then inserted a second Glidewire into the left ureteral orifice into the renal pelvis. I then removed the cystoscope from the bladder. I then placed a 12/14 F x 36 centimeter ureteral access sheath over the second wire  and into the mid ureter. I then removed the wire and performed a retrograde pyelogram. I then removed the inner part of the access sheath and using the digital ureteroscope passed a ureteroscope through the sheath and into the renal pelvis. At this point I removed several stone fragments using a 0 tip 19 [place] Nitinol basket. There was a stone that was too big to pass through the UPJ and as a result I used a 200  fiber with settings of 0.6 and 6 to fragment the stone into 3 smaller pieces. Pieces were then grasped with the 0 tip basket pulled through the access sheath and out the ureter. The remaining fragments were then extracted with the basket. Once all stones had been removed I then injected some more contrast through the ureteroscope and using fluoroscopy examined each calyx to ensure that the patient had no additional stone fragments. Once I was certain that there were no remaining fragments I removed the ureteroscope slowly and backed out the access sheath simultaneously. The ureter appeared to be in good condition and there were no fragments within the ureter. I then backloaded the safety wire into the cystoscope and passed a 5 French open-ended ureteral Pollock into the left ureter and removed the wire. I then injected more contrast into the renal pelvis and using fluoroscopy and measured the length of the ureter to the 24 cm. We then replaced the wire and removed the ureteral catheter. I then placed a 24 cm x 6 French double-J stent in the left ureter under fluoroscopic guidance. There was a nice cone noted in the lower pole of the left kidney as well as at the left ureteral  orifice. The bladder was then emptied, and cystoscope carefully removed from the urethra taking care to not pull the stent string. I then cut the string to a shorter length and tucked the string into the patient's vagina. The patient tolerated the procedure well. There were no perioperative complications.  Disposition: Patient is  being discharged home with instructions to remove her stent in 4 days. I will give her and her bionics to take around the time of stent removal.

## 2013-05-13 NOTE — Anesthesia Procedure Notes (Signed)
Procedure Name: LMA Insertion Date/Time: 05/13/2013 10:57 AM Performed by: Maris Berger T Pre-anesthesia Checklist: Patient identified, Emergency Drugs available, Suction available and Patient being monitored Patient Re-evaluated:Patient Re-evaluated prior to inductionOxygen Delivery Method: Circle System Utilized Preoxygenation: Pre-oxygenation with 100% oxygen Intubation Type: IV induction Ventilation: Mask ventilation without difficulty LMA: LMA inserted LMA Size: 4.0 Number of attempts: 1 Airway Equipment and Method: bite block Placement Confirmation: positive ETCO2 Dental Injury: Teeth and Oropharynx as per pre-operative assessment

## 2013-05-13 NOTE — H&P (View-Only) (Signed)
Active Problems Problems  1. Hematuria 599.70 2. Nephrolithiasis 592.0  History of Present Illness     Sarah Fuller is a 27 year old female who is self-referred for evaluation of hematuria and kidney stones. Pain began five days prior with intense intermittent flank pain.  Yesterday the pain was so intense he sought medical attention in the local ED.  CT scan was obtained revealing a 5 mm left UPJ stone with 5 smaller stone fragments scattered throughout the kidney.  She did have proximal hydro-.  In addition she had small ditzel stones on the left side.  The patient was given Flomax and Percocet discharged home with close follow-up with urology. Today she presents with pain relatively well controlled but taking Percocet around-the-clock.  She denies any fever but does endorse flushing, nausea, and small amounts of hematuria.  She has not had any changes in her voiding symptoms.  She denies abdominal or suprapubic pain.  The patient thinks that she has passed the stone in the past but has never had any urologic intervention.  She has a grandmother who has a history of kidney stones.   Past Medical History Problems  1. History of  Blood In Urine 599.70 2. History of  Lower Back Pain 724.2 3. History of  Nephrolithiasis V13.01  Surgical History Problems  1. History of  Oral Surgery Tooth Extraction  Current Meds 1. Omeprazole 20 MG Oral Tablet Delayed Release; Therapy: (Recorded:29Aug2014) to 2. Promethazine HCl 25 MG Oral Tablet; Therapy: (Recorded:29Aug2014) to 3. Roxicet 5-325 MG Oral Tablet; Therapy: (Recorded:29Aug2014) to 4. Tamsulosin HCl 0.4 MG Oral Capsule; Therapy: (Recorded:29Aug2014) to  Allergies Medication  1. Amoxicillin TABS 2. Sulfa Drugs  Family History Problems  1. Maternal grandmother's history of  Congestive Heart Failure 2. Maternal history of  Osteoarthritis V17.7 3. Family history of  Urinary Calculus  Social History Problems    Alcohol Use occassionally    Caffeine Use 2 to 3 servings of sweet tea   Marital History - Single   Never A Smoker  Review of Systems     A 12 point compressive review of systems was obtained and is negative unless stated in the HPI.     Vitals  29August2014 Blood Pressure: 120 / 83 Temperature: 98.3 F Heart Rate: 83 BMI Calculated: 33.79 BSA Calculated: 1.8 Height: 5 ft 1 in Weight: 179 lb   Physical Exam Constitutional: Well nourished and well developed . No acute distress.  ENT:. The ears and nose are normal in appearance.  Neck: The appearance of the neck is normal and no neck mass is present.  Pulmonary: No respiratory distress, normal respiratory rhythm and effort and clear bilateral breath sounds.  Cardiovascular: Heart rate and rhythm are normal . The arterial pulses are normal. No peripheral edema.  Abdomen: The abdomen is rounded, but not distended. The abdomen is soft and nontender. No right CVA tenderness and left CVA tenderness no CVA tenderness. No hernias are palpable. No hepatosplenomegaly noted.  Skin: Normal skin turgor, no visible rash and no visible skin lesions.  Neuro/Psych:. Mood and affect are appropriate.    Results/Data     Patient had a CT scan noncontrast abdomen and pelvis performed on 04/22/2012 which I personally reviewed which shows a 5 mm left UPJ stone with proximal hydronephrosis and 5 small stones scattered throughout the left renal pelvis.  In addition the patient had several small stone fragments in the right kidney without hydronephrosis.  The remainder of the patient's CAT scan is normal  Assessment     Left obstructing UPJ stone with several stone fragments in her left renal pelvis, patient's pain seems to be reasonably well controlled and she has no evidence of infection.   Plan  Nephrolithiasis (592.0)  1. OxyCODONE HCl 5 MG Oral Tablet; TAKE 1 TABLET EVERY 4 HOURS AS NEEDED FOR PAIN;  Therapy: 29Aug2014 to (Evaluate:06Sep2014); Last Rx:29Aug2014       We discussed the management of her stone great detail, including medical portion therapy, shockwave lithotripsy, and ureteroscopy.  I detailed to her the risk and benefits of each however I don't think she is likely to pass this stone anytime soon given that it still at the UPJ.  I offered shockwave lithotripsy 6 days from now which is the earliest we have available, and ureteroscopy the following day.  We discussed the discomfort was a stenting and the fact that ureteroscopy requires postoperative stent.  She knows this is the disadvantage over shockwave lithotripsy but the stone free rate is higher.  I also explained to her that with ureteroscopy we could try and remove all her stone fragments.  Patient has opted for ureteroscopy.  I detailed this operation for her in detail including the risk and benefits of the operation.  She understands she'll need a stent postoperatively.  She also understands that if I'm unable to access the kidney  endoscopically initially she will require a stent, which will allow the ureter to dilate up prior to ureteroscopic removal of her stone.  We tentatively decided on next Friday.  In the meantime I really prescription for oxycodone.  In addition I outlined a pain regimen including 1 g of Tylenol every 6 hours, ibuprofen 800 mg every 8 hours, and 2.5-5 mg of oxycodone when necessary.  In addition the patient will continue to take Flomax.  She knows to call if she has or develops any fevers or passes the stone.

## 2013-05-13 NOTE — Transfer of Care (Signed)
Immediate Anesthesia Transfer of Care Note  Patient: Sarah Fuller  Procedure(s) Performed: Procedure(s): CYSTOSCOPY/RETROGRADE/URETEROSCOPY (Left) CYSTOSCOPY WITH STENT REPLACEMENT (Left) HOLMIUM LASER APPLICATION (Left)  Patient Location: PACU  Anesthesia Type:General  Level of Consciousness: awake, alert  and oriented  Airway & Oxygen Therapy: Patient Spontanous Breathing and Patient connected to nasal cannula oxygen  Post-op Assessment: Report given to PACU RN and Post -op Vital signs reviewed and stable  Post vital signs: Reviewed and stable  Complications: No apparent anesthesia complications

## 2013-05-13 NOTE — Interval H&P Note (Signed)
History and Physical Interval Note:  05/13/2013 10:44 AM  Sarah Fuller  has presented today for surgery, with the diagnosis of Nephrolithiasis  The various methods of treatment have been discussed with the patient and family. After consideration of risks, benefits and other options for treatment, the patient has consented to  Procedure(s) with comments: CYSTOSCOPY/RETROGRADE/URETEROSCOPY (Left) CYSTOSCOPY WITH STENT REPLACEMENT (Left) - LEFT URETERAL STENT EXCHANGE HOLMIUM LASER APPLICATION (Left) - HLL STONE REMOVAL as a surgical intervention .  The patient's history has been reviewed, patient examined, no change in status, stable for surgery.  I have reviewed the patient's chart and labs.  Questions were answered to the patient's satisfaction.     Berniece Salines W

## 2013-05-16 ENCOUNTER — Encounter (HOSPITAL_BASED_OUTPATIENT_CLINIC_OR_DEPARTMENT_OTHER): Payer: Self-pay | Admitting: Urology

## 2013-05-18 ENCOUNTER — Emergency Department (HOSPITAL_COMMUNITY)
Admission: EM | Admit: 2013-05-18 | Discharge: 2013-05-18 | Disposition: A | Discharge status: Home / Self Care | Payer: BC Managed Care – PPO | Attending: Emergency Medicine | Admitting: Emergency Medicine

## 2013-05-18 ENCOUNTER — Emergency Department (HOSPITAL_COMMUNITY): Payer: BC Managed Care – PPO

## 2013-05-18 ENCOUNTER — Encounter (HOSPITAL_COMMUNITY): Payer: Self-pay | Admitting: Emergency Medicine

## 2013-05-18 DIAGNOSIS — N12 Tubulo-interstitial nephritis, not specified as acute or chronic: Secondary | ICD-10-CM | POA: Insufficient documentation

## 2013-05-18 DIAGNOSIS — Z880 Allergy status to penicillin: Secondary | ICD-10-CM | POA: Insufficient documentation

## 2013-05-18 DIAGNOSIS — K219 Gastro-esophageal reflux disease without esophagitis: Secondary | ICD-10-CM | POA: Insufficient documentation

## 2013-05-18 DIAGNOSIS — R5381 Other malaise: Secondary | ICD-10-CM | POA: Insufficient documentation

## 2013-05-18 DIAGNOSIS — Z87442 Personal history of urinary calculi: Secondary | ICD-10-CM | POA: Insufficient documentation

## 2013-05-18 DIAGNOSIS — Z79899 Other long term (current) drug therapy: Secondary | ICD-10-CM | POA: Insufficient documentation

## 2013-05-18 DIAGNOSIS — Z3202 Encounter for pregnancy test, result negative: Secondary | ICD-10-CM | POA: Insufficient documentation

## 2013-05-18 DIAGNOSIS — E860 Dehydration: Secondary | ICD-10-CM | POA: Insufficient documentation

## 2013-05-18 DIAGNOSIS — R109 Unspecified abdominal pain: Secondary | ICD-10-CM | POA: Insufficient documentation

## 2013-05-18 DIAGNOSIS — K5900 Constipation, unspecified: Secondary | ICD-10-CM | POA: Insufficient documentation

## 2013-05-18 LAB — CBC WITH DIFFERENTIAL/PLATELET
Basophils Absolute: 0 10*3/uL (ref 0.0–0.1)
Basophils Relative: 0 % (ref 0–1)
Eosinophils Absolute: 0 10*3/uL (ref 0.0–0.7)
HCT: 41.1 % (ref 36.0–46.0)
Lymphocytes Relative: 4 % — ABNORMAL LOW (ref 12–46)
MCH: 31.9 pg (ref 26.0–34.0)
MCHC: 35 g/dL (ref 30.0–36.0)
Monocytes Absolute: 2.8 10*3/uL — ABNORMAL HIGH (ref 0.1–1.0)
Neutro Abs: 22 10*3/uL — ABNORMAL HIGH (ref 1.7–7.7)
RDW: 13.3 % (ref 11.5–15.5)

## 2013-05-18 LAB — URINALYSIS, ROUTINE W REFLEX MICROSCOPIC
Bilirubin Urine: NEGATIVE
Ketones, ur: 15 mg/dL — AB
Nitrite: POSITIVE — AB
Protein, ur: 30 mg/dL — AB
Specific Gravity, Urine: 1.013 (ref 1.005–1.030)
Urobilinogen, UA: 0.2 mg/dL (ref 0.0–1.0)

## 2013-05-18 LAB — URINE MICROSCOPIC-ADD ON

## 2013-05-18 LAB — BASIC METABOLIC PANEL
BUN: 12 mg/dL (ref 6–23)
Calcium: 9.1 mg/dL (ref 8.4–10.5)
Creatinine, Ser: 1.06 mg/dL (ref 0.50–1.10)
GFR calc Af Amer: 84 mL/min — ABNORMAL LOW (ref >90)
GFR calc non Af Amer: 72 mL/min — ABNORMAL LOW (ref >90)
Glucose, Bld: 112 mg/dL — ABNORMAL HIGH (ref 70–99)
Potassium: 3.6 mEq/L (ref 3.5–5.1)

## 2013-05-18 MED ORDER — LEVOFLOXACIN 750 MG PO TABS: 750.0000 mg | ORAL_TABLET | Freq: Every day | ORAL | Status: DC

## 2013-05-18 MED ORDER — HYDROCODONE-ACETAMINOPHEN 5-325 MG PO TABS: 2.0000 | ORAL_TABLET | ORAL | Status: DC | PRN

## 2013-05-18 MED ORDER — SODIUM CHLORIDE 0.9 % IV BOLUS (SEPSIS)
1000.0000 mL | Freq: Once | INTRAVENOUS | Status: AC
  Administered 2013-05-18: 1000 mL via INTRAVENOUS

## 2013-05-18 MED ORDER — ACETAMINOPHEN 500 MG PO TABS
1000.0000 mg | ORAL_TABLET | Freq: Once | ORAL | Status: AC
  Administered 2013-05-18: 1000 mg via ORAL
  Filled 2013-05-18: qty 2

## 2013-05-18 MED ORDER — ONDANSETRON HCL 4 MG/2ML IJ SOLN
4.0000 mg | Freq: Once | INTRAMUSCULAR | Status: AC
  Administered 2013-05-18: 4 mg via INTRAVENOUS
  Filled 2013-05-18: qty 2

## 2013-05-18 MED ORDER — LEVOFLOXACIN IN D5W 750 MG/150ML IV SOLN
750.0000 mg | Freq: Once | INTRAVENOUS | Status: AC
  Administered 2013-05-18: 750 mg via INTRAVENOUS
  Filled 2013-05-18: qty 150

## 2013-05-18 MED ORDER — SODIUM CHLORIDE 0.9 % IV BOLUS (SEPSIS)
1000.0000 mL | Freq: Once | INTRAVENOUS | Status: DC
Start: 2013-05-18 — End: 2013-05-18

## 2013-05-18 MED ORDER — ONDANSETRON 4 MG PO TBDP: ORAL_TABLET | ORAL | Status: DC

## 2013-05-18 NOTE — ED Notes (Signed)
Pt c/o of fever x3 days, loss of appetite, dehydration. Nausea, denies vomiting diarrhea.

## 2013-05-18 NOTE — ED Provider Notes (Addendum)
CSN: [id number]     Arrival date & time 05/18/13  1029 History   First MD Initiated Contact with Patient 05/18/13 1046     Chief Complaint  Patient presents with  . Fever  . Dehydration   (Consider location/radiation/quality/duration/timing/severity/associated sxs/prior Treatment) HPI Comments: 27 yo female with recent kidney stone removal on Friday, stent removal Monday, took a dose of abx before and after procedure presents with left flank pain, nausea and fever for 24 hrs.  No current abx.  No new pain.  Nothing improves.  Decreased po intake.    Patient is a 27 y.o. female presenting with fever. The history is provided by the patient.  Fever Associated symptoms: no chest pain, no chills, no dysuria, no headaches, no rash and no vomiting     Past Medical History  Diagnosis Date  . GERD (gastroesophageal reflux disease)   . Left ureteral calculus   . Constipation   . History of kidney stones    Past Surgical History  Procedure Laterality Date  . Wisdom tooth extraction  AGE 80  . Cystoscopy with retrograde pyelogram, ureteroscopy and stent placement Left 04/29/2013    Procedure: LEFT URETEROSCOPY, LEFT DIGITAL URETEROSCOPY, LEFT RETROGRADE PYELOGRAM, LEFT URETER BALLOON DILATATION AND LEFT URETERAL STENT PLACEMENT;  Surgeon: Crist Fat, MD;  Location: [facility];  Service: Urology;  Laterality: Left;  . Cystoscopy/retrograde/ureteroscopy Left 05/13/2013    Procedure: CYSTOSCOPY/RETROGRADE/URETEROSCOPY;  Surgeon: Crist Fat, MD;  Location: [facility];  Service: Urology;  Laterality: Left;  . Cystoscopy w/ ureteral stent placement Left 05/13/2013    Procedure: CYSTOSCOPY WITH STENT REPLACEMENT;  Surgeon: Crist Fat, MD;  Location: [facility];  Service: Urology;  Laterality: Left;  . Holmium laser application Left 05/13/2013    Procedure: HOLMIUM LASER APPLICATION;  Surgeon: Crist Fat, MD;  Location: [facility];  Service: Urology;  Laterality: Left;   No family history on file. History  Substance Use Topics  . Smoking status: Never Smoker   . Smokeless tobacco: Never Used  . Alcohol Use: Yes     Comment: OCCASIONAL   OB History   Grav Para Term Preterm Abortions TAB SAB Ect Mult Living                 Review of Systems  Constitutional: Positive for fever, appetite change and fatigue. Negative for chills.  HENT: Negative for neck pain and neck stiffness.   Eyes: Negative for visual disturbance.  Respiratory: Negative for shortness of breath.   Cardiovascular: Negative for chest pain.  Gastrointestinal: Negative for vomiting and abdominal pain.  Genitourinary: Positive for flank pain. Negative for dysuria.  Musculoskeletal: Negative for back pain.  Skin: Negative for rash.  Neurological: Negative for light-headedness and headaches.    Allergies  Amoxicillin and Sulfa antibiotics  Home Medications   Current Outpatient Rx  Name  Route  Sig  Dispense  Refill  . ciprofloxacin (CIPRO) 500 MG tablet   Oral   Take 1 tablet (500 mg total) by mouth 2 (two) times daily.   2 tablet   0     Begin 1 hour prior to stent removal.   . docusate sodium (COLACE) 100 MG capsule   Oral   Take 100 mg by mouth as needed for constipation.         Marland Kitchen ibuprofen (ADVIL,MOTRIN) 200 MG tablet   Oral   Take 800 mg by mouth every 6 (six) hours as  needed for pain.         . Magnesium Hydroxide (PHILLIPS MILK OF MAGNESIA PO)   Oral   Take by mouth as needed.         Marland Kitchen omeprazole (PRILOSEC) 20 MG capsule   Oral   Take 20 mg by mouth as needed.         Marland Kitchen oxyCODONE (OXY IR/ROXICODONE) 5 MG immediate release tablet   Oral   Take 5 mg by mouth every 4 (four) hours as needed for pain.         . tamsulosin (FLOMAX) 0.4 MG CAPS capsule   Oral   Take 0.4 mg by mouth every morning.         . trospium (SANCTURA) 20 MG tablet   Oral   Take 1 tablet (20 mg total) by mouth  2 (two) times daily. For urinary frequency and bladder  spasm   20 tablet   1    BP 110/63  Pulse 109  Temp(Src) 102.3 F (39.1 C) (Oral)  Resp 18  SpO2 97%  LMP 04/08/2013 Physical Exam  Nursing note and vitals reviewed. Constitutional: She is oriented to person, place, and time. She appears well-developed and well-nourished.  HENT:  Head: Normocephalic and atraumatic.  Dry mm  Eyes: Conjunctivae are normal. Right eye exhibits no discharge. Left eye exhibits no discharge.  Neck: Normal range of motion. Neck supple. No tracheal deviation present.  Cardiovascular: Regular rhythm.  Tachycardia present.   Pulses:      Radial pulses are 2+ on the right side, and 2+ on the left side.  Pulmonary/Chest: Effort normal and breath sounds normal.  Abdominal: Soft. She exhibits no distension. There is no tenderness. There is no guarding.  Musculoskeletal: She exhibits tenderness (mild left posterior flank). She exhibits no edema.  Neurological: She is alert and oriented to person, place, and time.  Skin: Skin is warm. No rash noted.  Psychiatric: She has a normal mood and affect.    ED Course  Procedures (including critical care time)  Emergency Focused Ultrasound Exam Limited retroperitoneal ultrasound of kidneys  Performed and interpreted by Dr. Jodi Mourning Indication: flank pain Focused abdominal ultrasound with both kidneys imaged in transverse and longitudinal planes in real-time. Interpretation: mild left hydronephroureter visualized, no right hydro.  no stones or cysts visualized  Images archived electronically  Labs Review Labs Reviewed  CBC WITH DIFFERENTIAL - Abnormal; Notable for the following:    WBC 25.8 (*)    Neutrophils Relative % 85 (*)    Lymphocytes Relative 4 (*)    Neutro Abs 22.0 (*)    Monocytes Absolute 2.8 (*)    All other components within normal limits  BASIC METABOLIC PANEL - Abnormal; Notable for the following:    Sodium 133 (*)    Glucose, Bld 112  (*)    GFR calc non Af Amer 72 (*)    GFR calc Af Amer 84 (*)    All other components within normal limits  URINALYSIS, ROUTINE W REFLEX MICROSCOPIC - Abnormal; Notable for the following:    APPearance CLOUDY (*)    Hgb urine dipstick SMALL (*)    Ketones, ur 15 (*)    Protein, ur 30 (*)    Nitrite POSITIVE (*)    Leukocytes, UA LARGE (*)    All other components within normal limits  URINE MICROSCOPIC-ADD ON - Abnormal; Notable for the following:    Bacteria, UA MANY (*)    All other components within  normal limits  URINE CULTURE  PREGNANCY, URINE   Imaging Review No results found.  MDM  No diagnosis found. Recent stent removed. Pt with pyelonephritis.  Improved significantly in ED.  Levaquin given for infection. Discussed admission for close outpt fup, pt tolerating po, prefers to try outpt fup with abx. Mild hydro on bedside US, pt will call urology tomorrow am.  Spoke with urology on the phone and long discussion with patient.  Urology can see pt tomorrow or if pt agrees to come in the hospital. CT canceled, pt feels improved, pt will return for CT scan if worsening sxs. BP one reading was low but it was false reading done by a tech, nurse did prior to dc and 110.  Pt prefers to have close fup outpt and not be observed in the hospital at this time, she understands that if she has any worsening symptoms to return directly to the ER for CT scan and urology consult as small stone may be infected. Spoke with urology again, agreed with strict return instructions if pt not willing to be admitted or have CT at this time.   Pyelonephritis, Dehydration, SIRS  Filed Vitals:   05/18/13 1055 05/18/13 1126 05/18/13 1345 05/18/13 1448  BP: 110/63 111/68 100/58 110/68  Pulse: 109 107 75 100  Temp: 102.3 F (39.1 C)  101 F (38.3 C) 99.7 F (37.6 C)  TempSrc: Oral  Oral Oral  Resp: 18 18 18 20   SpO2: 97% 100% 97% 99%   Enid Skeens, MD 05/18/13 1442  Enid Skeens,  MD 05/18/13 0865

## 2013-05-20 LAB — URINE CULTURE: Colony Count: >=100000

## 2013-05-21 ENCOUNTER — Telehealth (HOSPITAL_COMMUNITY): Payer: Self-pay | Admitting: Emergency Medicine

## 2013-05-21 NOTE — Progress Notes (Signed)
ED Antimicrobial Stewardship Positive Culture Follow Up   Sarah Fuller is an 27 y.o. female who presented to [facility] on 05/18/2013 with a chief complaint of fever, dehydration  Chief Complaint  Patient presents with  . Fever  . Dehydration    Recent Results (from the past 720 hour(s))  URINE CULTURE     Status: None   Collection Time    04/25/13 12:16 PM      Result Value Range Status   Specimen Description URINE, CLEAN CATCH   Final   Special Requests NONE   Final   Culture  Setup Time     Final   Value: 04/25/2013 18:59     Performed at [facility] Count     Final   Value: NO GROWTH     Performed at [facility]   Culture     Final   Value: NO GROWTH     Performed at [facility]   Report Status 04/26/2013 FINAL   Final  URINE CULTURE     Status: None   Collection Time    05/18/13 12:33 PM      Result Value Range Status   Specimen Description URINE, CLEAN CATCH   Final   Special Requests NONE   Final   Culture  Setup Time     Final   Value: 05/18/2013 15:02     Performed at [facility] Count     Final   Value: >=100,000 COLONIES/ML     Performed at [facility]   Culture     Final   Value: ESCHERICHIA COLI     Performed at [facility]   Report Status 05/20/2013 FINAL   Final   Organism ID, Bacteria ESCHERICHIA COLI   Final    [x]  Treated with Levaquin, organism resistant to prescribed antimicrobial  [name] who presented to the [facility] on 9/24 with fever, dehydration, L-flank pain. The patient had recently had a kidney stone removed with a temporary stent placed -- also removed. The UCx grew E.coli that is resistant to the prescribed Levaquin and needs to be changed  New antibiotic prescription: Macrobid 100 mg bid x 10 days  ED Provider: Arthor Captain, PA-C  Rolley Sims 05/21/2013, 2:57 PM Infectious Diseases Pharmacist Phone# [phone]

## 2013-05-21 NOTE — ED Notes (Signed)
Post ED Visit - Positive Culture Follow-up: Successful Patient Follow-Up  Culture assessed and recommendations reviewed by: []  Wes Dulaney, Pharm.D., BCPS []  Celedonio Miyamoto, Pharm.D., BCPS [x]  Georgina Pillion, Pharm.D., BCPS []  [place], [address].D., BCPS, AAHIVP []  Estella Husk, Pharm.D., BCPS, AAHIVP  Positive urine culture  []  Patient discharged without antimicrobial prescription and treatment is now indicated [x]  Organism is resistant to prescribed ED discharge antimicrobial []  Patient with positive blood cultures  Changes discussed with ED provider: Arthor Captain PA-C New antibiotic prescription: Macrobid 100 mg BID x 10 days    Kylie A Holland 05/21/2013, 3:39 PM

## 2013-07-08 ENCOUNTER — Telehealth (HOSPITAL_COMMUNITY): Payer: Self-pay | Admitting: Emergency Medicine

## 2013-07-08 NOTE — ED Notes (Signed)
No response to letter sent after 30 days. Chart sent to Medical Records. °

## 2013-08-12 DIAGNOSIS — N200 Calculus of kidney: Secondary | ICD-10-CM | POA: Insufficient documentation

## 2015-01-16 IMAGING — RF DG RETROGRADE PYELOGRAM
1 series · 1 of 1 positions shown · non-contrast
Comparison: CT 04/21/2013

CLINICAL DATA: Left kidney stone.

RETROGRADE PYELOGRAM
TECHNIQUE: C-arm fluoroscopic images were obtained
intraoperatively and submitted for postoperative interpretation.
Please see the performing provider's procedural report for the
fluoroscopy time utilized.

[Series 1: run · 1 of 1 slices shown]
[im 1/1]
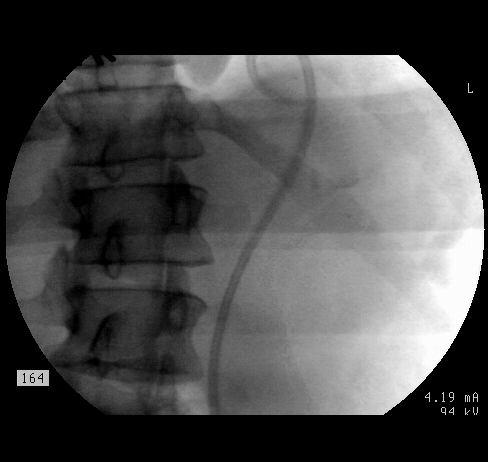

[1 of 1 positions shown; findings below may reference images not displayed]

FINDINGS: Single image demonstrates a left ureter stent.  Proximal
aspect of the stent is reconstituted in the region of the left
kidney.
IMPRESSION: Left ureter stent.

## 2016-03-19 DIAGNOSIS — F4310 Post-traumatic stress disorder, unspecified: Secondary | ICD-10-CM | POA: Insufficient documentation

## 2016-03-19 DIAGNOSIS — Z6841 Body Mass Index (BMI) 40.0 and over, adult: Secondary | ICD-10-CM | POA: Insufficient documentation

## 2016-03-19 DIAGNOSIS — F411 Generalized anxiety disorder: Secondary | ICD-10-CM | POA: Insufficient documentation

## 2016-04-30 DIAGNOSIS — R0683 Snoring: Secondary | ICD-10-CM | POA: Insufficient documentation

## 2017-12-08 ENCOUNTER — Other Ambulatory Visit: Payer: Self-pay | Admitting: Urology

## 2017-12-08 ENCOUNTER — Emergency Department (HOSPITAL_COMMUNITY): Payer: 59

## 2017-12-08 ENCOUNTER — Encounter (HOSPITAL_COMMUNITY): Payer: Self-pay | Admitting: Emergency Medicine

## 2017-12-08 ENCOUNTER — Emergency Department (HOSPITAL_COMMUNITY)
Admission: EM | Admit: 2017-12-08 | Discharge: 2017-12-08 | Disposition: A | Discharge status: Home / Self Care | Payer: 59 | Attending: Emergency Medicine | Admitting: Emergency Medicine

## 2017-12-08 ENCOUNTER — Other Ambulatory Visit: Payer: Self-pay

## 2017-12-08 DIAGNOSIS — N202 Calculus of kidney with calculus of ureter: Secondary | ICD-10-CM | POA: Diagnosis not present

## 2017-12-08 DIAGNOSIS — N200 Calculus of kidney: Secondary | ICD-10-CM

## 2017-12-08 DIAGNOSIS — R1031 Right lower quadrant pain: Secondary | ICD-10-CM | POA: Diagnosis present

## 2017-12-08 LAB — CBC WITH DIFFERENTIAL/PLATELET
BASOS PCT: 1 %
Basophils Absolute: 0 10*3/uL (ref 0.0–0.1)
EOS ABS: 0.1 10*3/uL (ref 0.0–0.7)
Eosinophils Relative: 2 %
HCT: 39.1 % (ref 36.0–46.0)
Hemoglobin: 12.2 g/dL (ref 12.0–15.0)
Lymphocytes Relative: 30 %
Lymphs Abs: 2.5 10*3/uL (ref 0.7–4.0)
MCH: 26.3 pg (ref 26.0–34.0)
MCHC: 31.2 g/dL (ref 30.0–36.0)
MCV: 84.4 fL (ref 78.0–100.0)
MONOS PCT: 8 %
Monocytes Absolute: 0.7 10*3/uL (ref 0.1–1.0)
NEUTROS PCT: 59 %
Neutro Abs: 4.9 10*3/uL (ref 1.7–7.7)
Platelets: 403 10*3/uL — ABNORMAL HIGH (ref 150–400)
RBC: 4.63 MIL/uL (ref 3.87–5.11)
RDW: 15 % (ref 11.5–15.5)
WBC: 8.2 10*3/uL (ref 4.0–10.5)

## 2017-12-08 LAB — COMPREHENSIVE METABOLIC PANEL
ALBUMIN: 3.8 g/dL (ref 3.5–5.0)
ALK PHOS: 84 U/L (ref 38–126)
ALT: 32 U/L (ref 14–54)
ANION GAP: 9 (ref 5–15)
AST: 23 U/L (ref 15–41)
BUN: 17 mg/dL (ref 6–20)
CALCIUM: 9 mg/dL (ref 8.9–10.3)
CHLORIDE: 108 mmol/L (ref 101–111)
CO2: 23 mmol/L (ref 22–32)
Creatinine, Ser: 0.99 mg/dL (ref 0.44–1.00)
GFR calc Af Amer: >60 mL/min (ref >60)
GFR calc non Af Amer: >60 mL/min (ref >60)
GLUCOSE: 118 mg/dL — AB (ref 65–99)
POTASSIUM: 3.9 mmol/L (ref 3.5–5.1)
SODIUM: 140 mmol/L (ref 135–145)
Total Bilirubin: 0.4 mg/dL (ref 0.3–1.2)
Total Protein: 6.9 g/dL (ref 6.5–8.1)

## 2017-12-08 LAB — URINALYSIS, ROUTINE W REFLEX MICROSCOPIC
Bilirubin Urine: NEGATIVE
Glucose, UA: NEGATIVE mg/dL
Ketones, ur: NEGATIVE mg/dL
Leukocytes, UA: NEGATIVE
Nitrite: NEGATIVE
PROTEIN: NEGATIVE mg/dL
Specific Gravity, Urine: 1.021 (ref 1.005–1.030)
pH: 5 (ref 5.0–8.0)

## 2017-12-08 LAB — I-STAT BETA HCG BLOOD, ED (MC, WL, AP ONLY)

## 2017-12-08 LAB — LIPASE, BLOOD: Lipase: 31 U/L (ref 11–51)

## 2017-12-08 MED ORDER — ACETAMINOPHEN 500 MG PO TABS: 500.0000 mg | ORAL_TABLET | Freq: Four times a day (QID) | ORAL | 0 refills | Status: DC | PRN

## 2017-12-08 MED ORDER — KETOROLAC TROMETHAMINE 15 MG/ML IJ SOLN
15.0000 mg | Freq: Once | INTRAMUSCULAR | Status: AC
  Administered 2017-12-08: 15 mg via INTRAVENOUS
  Filled 2017-12-08: qty 1

## 2017-12-08 MED ORDER — OXYCODONE-ACETAMINOPHEN 5-325 MG PO TABS: 2.0000 | ORAL_TABLET | ORAL | 0 refills | Status: DC | PRN

## 2017-12-08 MED ORDER — TAMSULOSIN HCL 0.4 MG PO CAPS: 0.4000 mg | ORAL_CAPSULE | Freq: Every day | ORAL | 0 refills | Status: DC

## 2017-12-08 MED ORDER — FENTANYL CITRATE (PF) 100 MCG/2ML IJ SOLN
50.0000 ug | INTRAMUSCULAR | Status: AC | PRN
  Administered 2017-12-08 (×2): 50 ug via INTRAVENOUS
  Filled 2017-12-08 (×2): qty 2

## 2017-12-08 MED ORDER — SODIUM CHLORIDE 0.9 % IV BOLUS
500.0000 mL | Freq: Once | INTRAVENOUS | Status: AC
  Administered 2017-12-08: 500 mL via INTRAVENOUS

## 2017-12-08 MED ORDER — ONDANSETRON HCL 4 MG PO TABS: 4.0000 mg | ORAL_TABLET | Freq: Three times a day (TID) | ORAL | 0 refills | Status: DC | PRN

## 2017-12-08 MED ORDER — ONDANSETRON HCL 4 MG/2ML IJ SOLN
4.0000 mg | Freq: Once | INTRAMUSCULAR | Status: AC
  Administered 2017-12-08: 4 mg via INTRAVENOUS
  Filled 2017-12-08: qty 2

## 2017-12-08 NOTE — Discharge Instructions (Addendum)
You will be given a prescription for Flomax, pain medication, and nausea medication.  You should not drive, work, or operate machinery while taking the pain medication as it can make you very drowsy.  You may take 500mg Tylenol every 6 hours and take one percocet every 8 hours for breakthrough pain. Make sure you stay well hydrated. You will need to follow-up with urology for reevaluation and for further treatment of your kidney stone.  You will need to return to the emergency department for any fevers, persistent pain, persistent vomiting, inability to urinate, or any new or worsening symptoms. ° °

## 2017-12-08 NOTE — ED Triage Notes (Signed)
Pt from home with c/o right flank pain that began at 0400 this morning. Pt has hx of kidney stones and states this feels similar. Pt is neither febrile nor tachycardic and pt denies any hematuria at current time

## 2017-12-08 NOTE — ED Provider Notes (Signed)
[facility]-EMERGENCY DEPT Provider Note   CSN: [id number] Arrival date & time: 12/08/17  2130     History   Chief Complaint No chief complaint on file.   HPI Sarah Fuller is a 32 y.o. female.  HPI   Patient is a 33 year old female with a history of kidney stones, constipation, GERD who presents the ED today complaining of sudden onset right-sided flank pain that began around 4 AM this morning.  Patient states pain is 10/10 and constant.  Pain radiates to right mid abdomen to right lower quadrant.  Feels similar to past kidney stones.  There are no exacerbating factors.  States she tried taking hydrocodone prior to arrival which mildly improved her symptoms.  Is also complaining of nausea, sweats, and diarrhea this morning, but denies any fevers, vomiting.  No blood in stool or urine.  Complains of mild dysuria, denies frequency, urgency.  No vaginal bleeding, vaginal discharge, or vaginal symptoms.  States her last kidney stone was last week.  Had CT renal study completed by urology office which showed mobile stones in the right kidney.  States that she passed 1 of the stones last week and pain at resolved however returned this morning.  Past Medical History:  Diagnosis Date  . Constipation   . GERD (gastroesophageal reflux disease)   . History of kidney stones   . Left ureteral calculus     There are no active problems to display for this patient.   Past Surgical History:  Procedure Laterality Date  . CYSTOSCOPY W/ URETERAL STENT PLACEMENT Left 05/13/2013   Procedure: CYSTOSCOPY WITH STENT REPLACEMENT;  Surgeon: Crist Fat, MD;  Location: [facility];  Service: Urology;  Laterality: Left;  . CYSTOSCOPY WITH RETROGRADE PYELOGRAM, URETEROSCOPY AND STENT PLACEMENT Left 04/29/2013   Procedure: LEFT URETEROSCOPY, LEFT DIGITAL URETEROSCOPY, LEFT RETROGRADE PYELOGRAM, LEFT URETER BALLOON DILATATION AND LEFT URETERAL STENT PLACEMENT;   Surgeon: Crist Fat, MD;  Location: [facility];  Service: Urology;  Laterality: Left;  . CYSTOSCOPY/RETROGRADE/URETEROSCOPY Left 05/13/2013   Procedure: CYSTOSCOPY/RETROGRADE/URETEROSCOPY;  Surgeon: Crist Fat, MD;  Location: [facility];  Service: Urology;  Laterality: Left;  . HOLMIUM LASER APPLICATION Left 05/13/2013   Procedure: HOLMIUM LASER APPLICATION;  Surgeon: Crist Fat, MD;  Location: [facility];  Service: Urology;  Laterality: Left;  . WISDOM TOOTH EXTRACTION  AGE 54     OB History   None      Home Medications    Prior to Admission medications   Medication Sig Start Date End Date Taking? Authorizing Provider  docusate sodium (COLACE) 100 MG capsule Take 100 mg by mouth as needed for constipation.   Yes [provider]  escitalopram (LEXAPRO) 20 MG tablet Take 20 mg by mouth daily. 11/28/17  Yes [provider]  HYDROcodone-acetaminophen (NORCO) 5-325 MG per tablet Take 2 tablets by mouth every 4 (four) hours as needed for pain. Patient taking differently: Take 1 tablet by mouth every 8 (eight) hours as needed for moderate pain.  05/18/13  Yes Blane Ohara, MD  ibuprofen (ADVIL,MOTRIN) 200 MG tablet Take 800 mg by mouth every 6 (six) hours as needed for pain.   Yes [provider]  omeprazole (PRILOSEC) 20 MG capsule Take 20 mg by mouth as needed.   Yes [provider]  ondansetron (ZOFRAN ODT) 4 MG disintegrating tablet 4mg  ODT q4 hours prn nausea/vomit 05/18/13  Yes Blane Ohara, MD  acetaminophen (TYLENOL) 500  MG tablet Take 1 tablet (500 mg total) by mouth every 6 (six) hours as needed. 12/08/17   Topher Buenaventura S, PA-C  ciprofloxacin (CIPRO) 500 MG tablet Take 1 tablet (500 mg total) by mouth 2 (two) times daily. Patient not taking: Reported on 12/08/2017 05/13/13   Crist Fat, MD  levofloxacin (LEVAQUIN) 750 MG tablet Take 1 tablet (750 mg total) by mouth  daily. Patient not taking: Reported on 12/08/2017 05/19/13   Blane Ohara, MD  ondansetron (ZOFRAN) 4 MG tablet Take 1 tablet (4 mg total) by mouth every 8 (eight) hours as needed for nausea or vomiting. 12/08/17   Zynia Wojtowicz S, PA-C  oxyCODONE-acetaminophen (PERCOCET/ROXICET) 5-325 MG tablet Take 2 tablets by mouth every 4 (four) hours as needed for severe pain. 12/08/17   Lurena Naeve S, PA-C  tamsulosin (FLOMAX) 0.4 MG CAPS capsule Take 1 capsule (0.4 mg total) by mouth daily. 12/08/17   Messiah Rovira S, PA-C  trospium (SANCTURA) 20 MG tablet Take 1 tablet (20 mg total) by mouth 2 (two) times daily. For urinary frequency and bladder  spasm Patient not taking: Reported on 12/08/2017 04/29/13   Crist Fat, MD    Family History No family history on file.  Social History Social History   Tobacco Use  . Smoking status: Never Smoker  . Smokeless tobacco: Never Used  Substance Use Topics  . Alcohol use: Yes    Comment: OCCASIONAL  . Drug use: No     Allergies   Amoxicillin and Sulfa antibiotics   Review of Systems Review of Systems  Constitutional: Positive for diaphoresis. Negative for fever.  HENT: Negative for congestion.   Eyes: Negative for visual disturbance.  Respiratory: Negative for shortness of breath.   Cardiovascular: Negative for chest pain.  Gastrointestinal: Positive for abdominal pain, diarrhea and nausea. Negative for blood in stool, constipation and vomiting.  Genitourinary: Positive for dysuria and flank pain. Negative for decreased urine volume, difficulty urinating, frequency, hematuria, urgency, vaginal bleeding and vaginal discharge.  Musculoskeletal: Negative for back pain.  Skin: Negative for wound.  Neurological: Negative for headaches.     Physical Exam Updated Vital Signs BP (!) 130/92 (BP Location: Right Arm)   Pulse 75   Temp 98.9 F (37.2 C) (Oral)   Resp 18   SpO2 99%   Physical Exam  Constitutional: She appears  well-developed and well-nourished.  Mild distress  HENT:  Head: Normocephalic and atraumatic.  Mouth/Throat: Oropharynx is clear and moist.  Eyes: Pupils are equal, round, and reactive to light. Conjunctivae and EOM are normal.  Neck: Neck supple.  Cardiovascular: Normal rate, regular rhythm, normal heart sounds and intact distal pulses.  No murmur heard. Pulmonary/Chest: Effort normal and breath sounds normal. No respiratory distress. She has no wheezes.  Abdominal: Soft. Bowel sounds are normal. She exhibits no distension. There is no guarding.  Mild RLQ ttp  Musculoskeletal: She exhibits no edema.  Neurological: She is alert.  Skin: Skin is warm and dry.  Psychiatric: She has a normal mood and affect.  Nursing note and vitals reviewed.    ED Treatments / Results  Labs (all labs ordered are listed, but only abnormal results are displayed) Labs Reviewed  URINALYSIS, ROUTINE W REFLEX MICROSCOPIC - Abnormal; Notable for the following components:      Result Value   APPearance HAZY (*)    Hgb urine dipstick LARGE (*)    Bacteria, UA RARE (*)    Squamous Epithelial / LPF 0-5 (*)  All other components within normal limits  CBC WITH DIFFERENTIAL/PLATELET - Abnormal; Notable for the following components:   Platelets 403 (*)    All other components within normal limits  COMPREHENSIVE METABOLIC PANEL - Abnormal; Notable for the following components:   Glucose, Bld 118 (*)    All other components within normal limits  LIPASE, BLOOD  I-STAT BETA HCG BLOOD, ED ([facility], [facility], [facility] ONLY)    EKG None  Radiology Ct Renal Stone Study  Result Date: 12/08/2017 CLINICAL DATA:  Right-sided flank pain for several hours EXAM: CT ABDOMEN AND PELVIS WITHOUT CONTRAST TECHNIQUE: Multidetector CT imaging of the abdomen and pelvis was performed following the standard protocol without IV contrast. COMPARISON:  12/02/2017 FINDINGS: Lower chest: Lung bases demonstrate some minimal scarring in the left  lower lobe. Hepatobiliary: No focal liver abnormality is seen. No gallstones, gallbladder wall thickening, or biliary dilatation. Pancreas: Unremarkable. No pancreatic ductal dilatation or surrounding inflammatory changes. Spleen: Normal in size without focal abnormality. Adrenals/Urinary Tract: Adrenal glands are within normal limits. Tiny nonobstructing left renal stones are seen. No obstructive changes or ureteral calculi are noted. The bladder is well distended. Right kidney demonstrates some increase in perinephric stranding. Multiple renal calculi are again noted. Some mild fullness of the collecting system is noted secondary to migration of a right lower pole renal stone into the proximal right ureter. This stone measures approximately 6 mm in greatest dimension. No more distal right ureteral stones are seen. Stomach/Bowel: Stomach is within normal limits. Appendix appears normal. No evidence of bowel wall thickening, distention, or inflammatory changes. Vascular/Lymphatic: No significant vascular findings are present. No enlarged abdominal or pelvic lymph nodes. Reproductive: Uterus and bilateral adnexa are unremarkable. Other: No abdominal wall hernia or abnormality. No abdominopelvic ascites. Musculoskeletal: No acute or significant osseous findings. IMPRESSION: Bilateral nonobstructing renal stones. Migration of previously seen lower pole right renal stone into the proximal right ureter. It measures 6 mm with mild hydronephrotic change. Electronically Signed   By: Alcide CleverMark  Lukens M.D.   On: 12/08/2017 09:56    Procedures Procedures (including critical care time)  Medications Ordered in ED Medications  sodium chloride 0.9 % bolus 500 mL (0 mLs Intravenous Stopped 12/08/17 1002)  fentaNYL (SUBLIMAZE) injection 50 mcg (50 mcg Intravenous Given 12/08/17 1009)  ondansetron (ZOFRAN) injection 4 mg (4 mg Intravenous Given 12/08/17 0912)  ketorolac (TORADOL) 15 MG/ML injection 15 mg (15 mg Intravenous Given  12/08/17 1008)     Initial Impression / Assessment and Plan / ED Course  I have reviewed the triage vital signs and the nursing notes.  Pertinent labs & imaging results that were available during my care of the patient were reviewed by me and considered in my medical decision making (see chart for details).     Final Clinical Impressions(s) / ED Diagnoses   Final diagnoses:  Kidney stone   Pt has been diagnosed with a Kidney Stone via CT. There is evidence of some mild hydronephrosis, serum creatine WNL, vitals sign stable and the pt does not have irratractable vomiting. UA without evidence of infection. CBC without leukocytosis. Will give flomax, pain medication, and nausea medication.  Patient sees Dr. Laurie PandaHessick from urology and has an appointment scheduled for 3:00 PM today.  Advised patient to keep this appointment.  Advised to return for any fevers, intractable vomiting, worsening abdominal or flank pain, or any new or worsening symptoms.  Patient or since the plan, her family is at bedside and also understands the plan.  Family  and patient understand reasons to return to the ED.  All questions answered.  ED Discharge Orders        Ordered    Urine Culture     12/08/17 1147    tamsulosin (FLOMAX) 0.4 MG CAPS capsule  Daily     12/08/17 1152    acetaminophen (TYLENOL) 500 MG tablet  Every 6 hours PRN     12/08/17 1152    oxyCODONE-acetaminophen (PERCOCET/ROXICET) 5-325 MG tablet  Every 4 hours PRN     12/08/17 1152    ondansetron (ZOFRAN) 4 MG tablet  Every 8 hours PRN     12/08/17 1152       Koray Soter S, PA-C 12/08/17 1611    Azalia Bilis, MD 12/08/17 1617

## 2017-12-09 ENCOUNTER — Encounter (HOSPITAL_COMMUNITY): Payer: Self-pay | Admitting: General Practice

## 2017-12-09 LAB — URINE CULTURE

## 2017-12-09 NOTE — H&P (Signed)
Urology Preoperative H&P   Chief Complaint: Right flank pain  History of Present Illness: ZIONNA HOMEWOOD is a 32 y.o. female with The problem is on the right side. She first stated noticing pain on 11/30/2017. This is not her first kidney stone. She has had 2 stones prior to getting this one. She is currently having flank pain, back pain, and nausea. She denies having groin pain, vomiting, fever, and chills. She has not caught a stone in her urine strainer since her symptoms began. She has hadureteral stent and ureteroscopy for treatment of her stones in the past.   Patient has a history of nephrolithiasis. She has had to undergo a ureteroscopy in the past. She has passed other stones. She has never had an ESWL. She underwent a CT scan of the abdomen and pelvis yesterday that revealed a 5 mm proximal right ureteral calculus. She continues to have significant right flank pain currently about 3 out of 10. It is moderately controlled. During the office visit, she did have an episode of severe pain that improved after administration of morphine. She has some nausea but no vomiting. She has been taking Flomax. She has hydrocodone.    Past Medical History:  Diagnosis Date  . Constipation   . GERD (gastroesophageal reflux disease)   . History of kidney stones   . Left ureteral calculus     Past Surgical History:  Procedure Laterality Date  . CYSTOSCOPY W/ URETERAL STENT PLACEMENT Left 05/13/2013   Procedure: CYSTOSCOPY WITH STENT REPLACEMENT;  Surgeon: Crist Fat, MD;  Location: [facility];  Service: Urology;  Laterality: Left;  . CYSTOSCOPY WITH RETROGRADE PYELOGRAM, URETEROSCOPY AND STENT PLACEMENT Left 04/29/2013   Procedure: LEFT URETEROSCOPY, LEFT DIGITAL URETEROSCOPY, LEFT RETROGRADE PYELOGRAM, LEFT URETER BALLOON DILATATION AND LEFT URETERAL STENT PLACEMENT;  Surgeon: Crist Fat, MD;  Location: [facility];  Service: Urology;  Laterality: Left;  .  CYSTOSCOPY/RETROGRADE/URETEROSCOPY Left 05/13/2013   Procedure: CYSTOSCOPY/RETROGRADE/URETEROSCOPY;  Surgeon: Crist Fat, MD;  Location: [facility];  Service: Urology;  Laterality: Left;  . HOLMIUM LASER APPLICATION Left 05/13/2013   Procedure: HOLMIUM LASER APPLICATION;  Surgeon: Crist Fat, MD;  Location: [facility];  Service: Urology;  Laterality: Left;  . WISDOM TOOTH EXTRACTION  AGE 68    Allergies:  Allergies  Allergen Reactions  . Amoxicillin Rash  . Sulfa Antibiotics Rash    No family history on file.  Social History:  reports that she has never smoked. She has never used smokeless tobacco. She reports that she drinks alcohol. She reports that she does not use drugs.  ROS: A complete review of systems was performed.  All systems are negative except for pertinent findings as noted.  Physical Exam:  Vital signs in last 24 hours: Pulse Rate:  [75] 75 (04/16 1215) Resp:  [18] 18 (04/16 1215) BP: (130)/(92) 130/92 (04/16 1215) SpO2:  [99 %] 99 % (04/16 1215) Constitutional:  Alert and oriented, No acute distress Cardiovascular: Regular rate and rhythm, No JVD Respiratory: Normal respiratory effort, Lungs clear bilaterally GI: Abdomen is soft, nontender, nondistended, no abdominal masses GU: No CVA tenderness Lymphatic: No lymphadenopathy Neurologic: Grossly intact, no focal deficits Psychiatric: Normal mood and affect  Laboratory Data:  Recent Labs    12/08/17 0836  WBC 8.2  HGB 12.2  HCT 39.1  PLT 403*    Recent Labs    12/08/17 0836  NA 140  K 3.9  CL 108  GLUCOSE 118*  BUN 17  CALCIUM 9.0  CREATININE 0.99     Results for orders placed or performed during the hospital encounter of 12/08/17 (from the past 24 hour(s))  Urinalysis, Routine w reflex microscopic- may I&O cath if menses     Status: Abnormal   Collection Time: 12/08/17 10:47 AM  Result Value Ref Range   Color, Urine YELLOW YELLOW   APPearance  HAZY (A) CLEAR   Specific Gravity, Urine 1.021 1.005 - 1.030   pH 5.0 5.0 - 8.0   Glucose, UA NEGATIVE NEGATIVE mg/dL   Hgb urine dipstick LARGE (A) NEGATIVE   Bilirubin Urine NEGATIVE NEGATIVE   Ketones, ur NEGATIVE NEGATIVE mg/dL   Protein, ur NEGATIVE NEGATIVE mg/dL   Nitrite NEGATIVE NEGATIVE   Leukocytes, UA NEGATIVE NEGATIVE   RBC / HPF TOO NUMEROUS TO COUNT 0 - 5 RBC/hpf   WBC, UA 0-5 0 - 5 WBC/hpf   Bacteria, UA RARE (A) NONE SEEN   Squamous Epithelial / LPF 0-5 (A) NONE SEEN   Mucus PRESENT    No results found for this or any previous visit (from the past 240 hour(s)).  Renal Function: Recent Labs    12/08/17 0836  CREATININE 0.99   CrCl cannot be calculated (Unknown ideal weight.).  Radiologic Imaging: Ct Renal Stone Study  Result Date: 12/08/2017 CLINICAL DATA:  Right-sided flank pain for several hours EXAM: CT ABDOMEN AND PELVIS WITHOUT CONTRAST TECHNIQUE: Multidetector CT imaging of the abdomen and pelvis was performed following the standard protocol without IV contrast. COMPARISON:  12/02/2017 FINDINGS: Lower chest: Lung bases demonstrate some minimal scarring in the left lower lobe. Hepatobiliary: No focal liver abnormality is seen. No gallstones, gallbladder wall thickening, or biliary dilatation. Pancreas: Unremarkable. No pancreatic ductal dilatation or surrounding inflammatory changes. Spleen: Normal in size without focal abnormality. Adrenals/Urinary Tract: Adrenal glands are within normal limits. Tiny nonobstructing left renal stones are seen. No obstructive changes or ureteral calculi are noted. The bladder is well distended. Right kidney demonstrates some increase in perinephric stranding. Multiple renal calculi are again noted. Some mild fullness of the collecting system is noted secondary to migration of a right lower pole renal stone into the proximal right ureter. This stone measures approximately 6 mm in greatest dimension. No more distal right ureteral stones  are seen. Stomach/Bowel: Stomach is within normal limits. Appendix appears normal. No evidence of bowel wall thickening, distention, or inflammatory changes. Vascular/Lymphatic: No significant vascular findings are present. No enlarged abdominal or pelvic lymph nodes. Reproductive: Uterus and bilateral adnexa are unremarkable. Other: No abdominal wall hernia or abnormality. No abdominopelvic ascites. Musculoskeletal: No acute or significant osseous findings. IMPRESSION: Bilateral nonobstructing renal stones. Migration of previously seen lower pole right renal stone into the proximal right ureter. It measures 6 mm with mild hydronephrotic change. Electronically Signed   By: Alcide Clever M.D.   On: 12/08/2017 09:56    I independently reviewed the above imaging studies.  Assessment and Plan ERIAN LARIVIERE is a 32 y.o. female with a 5 mm right UPJ stone  The risks, benefits and alternatives of RIGHT ESWL was discussed with the patient. I described the risks which include arrhythmia, kidney contusion, kidney hemorrhage, need for transfusion, back discomfort, flank ecchymosis, flank abrasion, inability to break up stone, inability to pass stone fragments, Steinstrasse, infection associated with obstructing stones, need for different surgical procedure and possible need for repeat shockwave lithotripsy.  The patient voices understanding and wishes to proceed.  Rhoderick Moodyhristopher Marciana Uplinger, MD 12/09/2017, 9:58 AM  [facility] Pager: [phone]

## 2017-12-10 ENCOUNTER — Encounter (HOSPITAL_COMMUNITY): Payer: Self-pay | Admitting: *Deleted

## 2017-12-10 ENCOUNTER — Ambulatory Visit (HOSPITAL_COMMUNITY): Payer: 59

## 2017-12-10 ENCOUNTER — Encounter (HOSPITAL_COMMUNITY)
Admission: RE | Disposition: A | Discharge status: Home / Self Care | Payer: Self-pay | Source: Ambulatory Visit | Attending: Urology

## 2017-12-10 ENCOUNTER — Other Ambulatory Visit: Payer: Self-pay

## 2017-12-10 ENCOUNTER — Ambulatory Visit (HOSPITAL_COMMUNITY)
Admission: RE | Admit: 2017-12-10 | Discharge: 2017-12-10 | Disposition: A | Discharge status: Home / Self Care | Payer: 59 | Source: Ambulatory Visit | Attending: Urology | Admitting: Urology

## 2017-12-10 DIAGNOSIS — Z87442 Personal history of urinary calculi: Secondary | ICD-10-CM | POA: Diagnosis not present

## 2017-12-10 DIAGNOSIS — Z882 Allergy status to sulfonamides status: Secondary | ICD-10-CM | POA: Insufficient documentation

## 2017-12-10 DIAGNOSIS — Z880 Allergy status to penicillin: Secondary | ICD-10-CM | POA: Insufficient documentation

## 2017-12-10 DIAGNOSIS — N132 Hydronephrosis with renal and ureteral calculous obstruction: Secondary | ICD-10-CM | POA: Diagnosis not present

## 2017-12-10 DIAGNOSIS — K219 Gastro-esophageal reflux disease without esophagitis: Secondary | ICD-10-CM | POA: Diagnosis not present

## 2017-12-10 DIAGNOSIS — N201 Calculus of ureter: Secondary | ICD-10-CM

## 2017-12-10 HISTORY — DX: Depression, unspecified: F32.A

## 2017-12-10 HISTORY — DX: Anxiety disorder, unspecified: F41.9

## 2017-12-10 HISTORY — PX: EXTRACORPOREAL SHOCK WAVE LITHOTRIPSY: SHX1557

## 2017-12-10 HISTORY — DX: Major depressive disorder, single episode, unspecified: F32.9

## 2017-12-10 LAB — PREGNANCY, URINE: Preg Test, Ur: NEGATIVE

## 2017-12-10 SURGERY — LITHOTRIPSY, ESWL
Anesthesia: LOCAL | Laterality: Right

## 2017-12-10 MED ORDER — OXYCODONE HCL 5 MG PO TABS
5.0000 mg | ORAL_TABLET | Freq: Once | ORAL | Status: AC
  Administered 2017-12-10: 5 mg via ORAL

## 2017-12-10 MED ORDER — DIAZEPAM 5 MG PO TABS
10.0000 mg | ORAL_TABLET | ORAL | Status: AC
  Administered 2017-12-10: 10 mg via ORAL
  Filled 2017-12-10: qty 2

## 2017-12-10 MED ORDER — CIPROFLOXACIN HCL 500 MG PO TABS
500.0000 mg | ORAL_TABLET | ORAL | Status: AC
  Administered 2017-12-10: 500 mg via ORAL
  Filled 2017-12-10: qty 1

## 2017-12-10 MED ORDER — DIPHENHYDRAMINE HCL 25 MG PO CAPS
25.0000 mg | ORAL_CAPSULE | ORAL | Status: AC
  Administered 2017-12-10: 25 mg via ORAL
  Filled 2017-12-10: qty 1

## 2017-12-10 MED ORDER — OXYCODONE HCL 5 MG PO TABS
ORAL_TABLET | ORAL | Status: AC
  Filled 2017-12-10: qty 1

## 2017-12-10 MED ORDER — SODIUM CHLORIDE 0.9 % IV SOLN
INTRAVENOUS | Status: DC
  Administered 2017-12-10: 09:00:00 via INTRAVENOUS

## 2017-12-10 NOTE — Op Note (Signed)
[facility] Operative Note  Treating Physician: Rhoderick Moodyhristopher Mariama Saintvil, MD  Pre-op diagnosis: 5 mm right mid-ureteral stone  Post-op diagnosis: 5 mm right UVJ stone  Procedure: Right ESWL  [facility] into chart. Also because of the size, density, location and other factors that cannot be anticipated I feel this will likely be a staged procedure. This fact supersedes any indication in the scanned [facility] operative note to the contrary

## 2017-12-11 ENCOUNTER — Encounter (HOSPITAL_COMMUNITY): Payer: Self-pay | Admitting: Urology

## 2019-08-27 IMAGING — CT CT RENAL STONE PROTOCOL
2 of 4 series · 17 of 46 positions shown, 19 images · non-contrast
Comparison: 12/02/2017

CLINICAL DATA: Right-sided flank pain for several hours

EXAM:
CT ABDOMEN AND PELVIS WITHOUT CONTRAST
TECHNIQUE: Multidetector CT imaging of the abdomen and pelvis was performed
following the standard protocol without IV contrast.

[Series 2: axial st · axial · 0.76mm/px · z∈[-459,-54]mm · 14 of 93 slices shown, 16 images]
[im 6/93  soft-tissue]
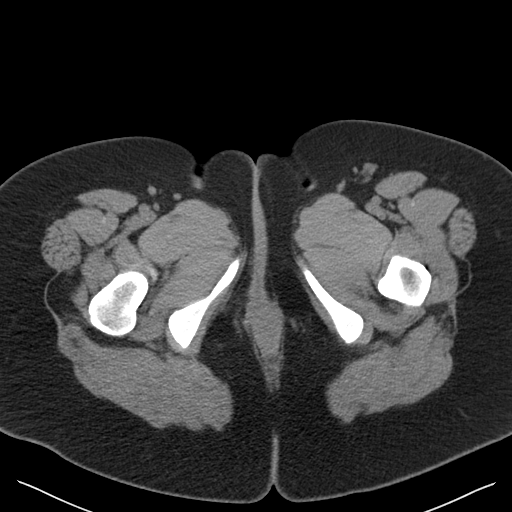
[im 6/93  bone]
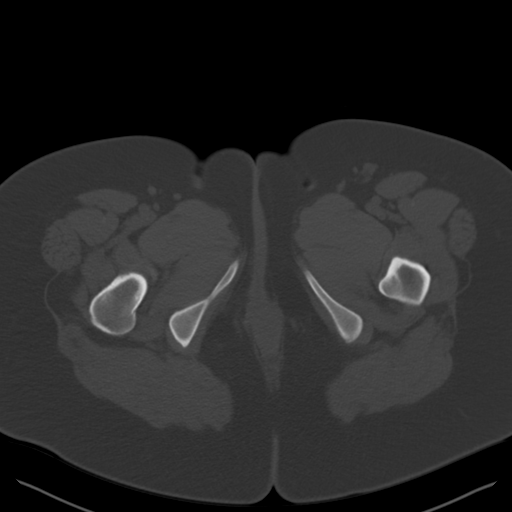
[im 11/93  soft-tissue]
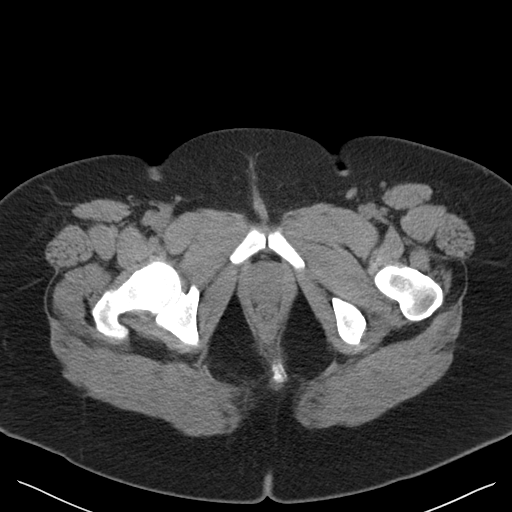
[im 17/93  soft-tissue]
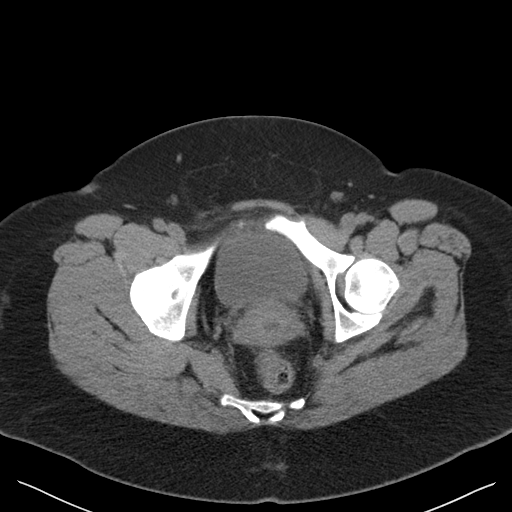
[im 28/93  soft-tissue]
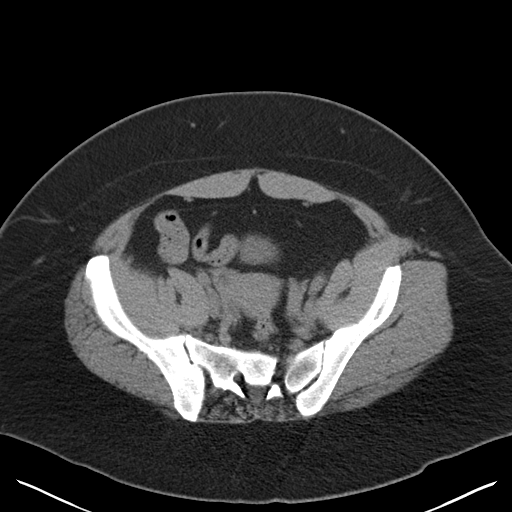
[im 33/93  soft-tissue]
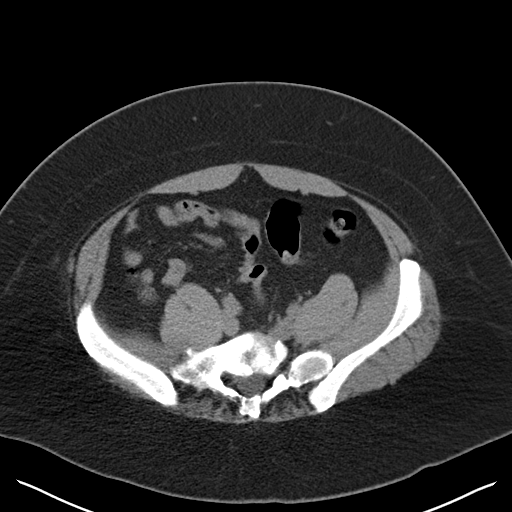
[im 38/93  soft-tissue]
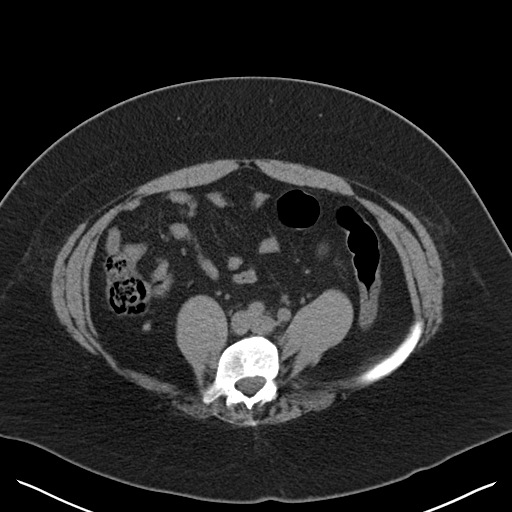
[im 44/93  soft-tissue]
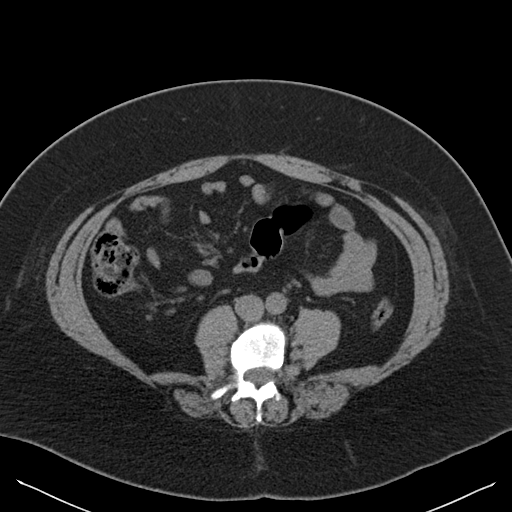
[im 49/93  soft-tissue]
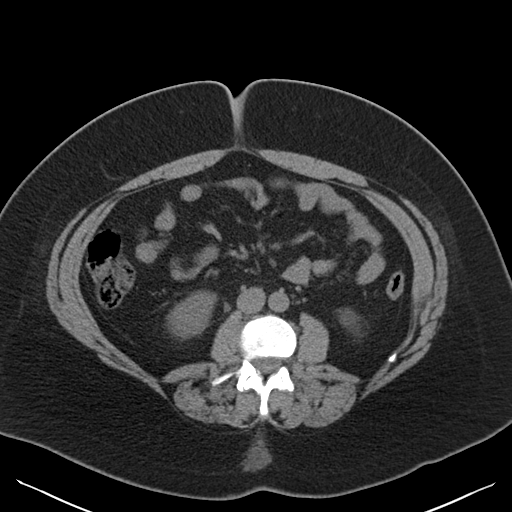
[im 55/93  soft-tissue]
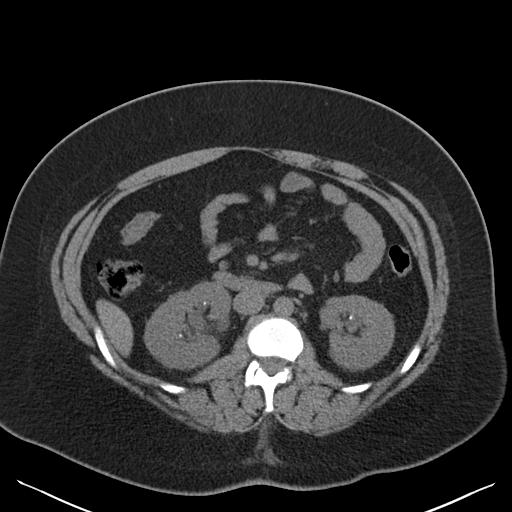
[im 55/93  bone]
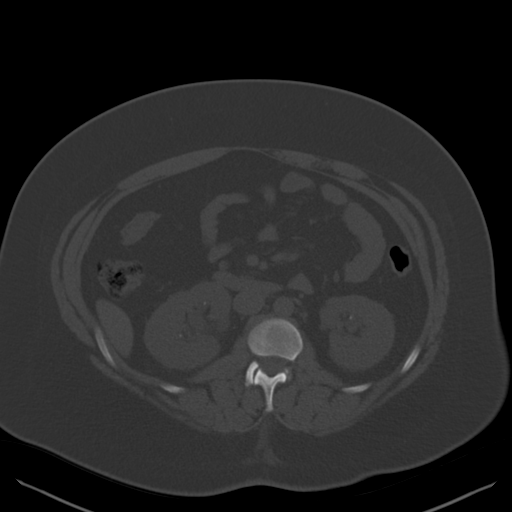
[im 60/93  soft-tissue]
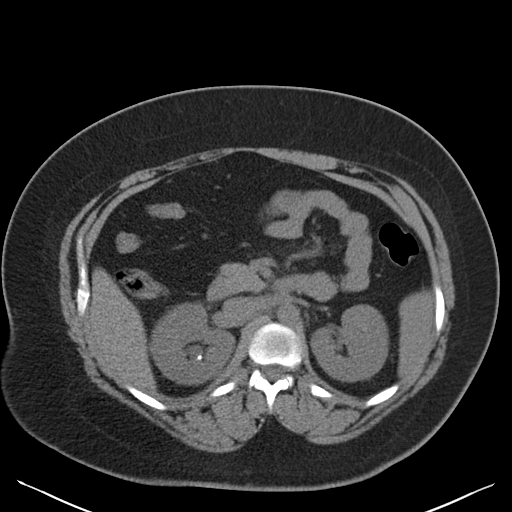
[im 71/93  soft-tissue]
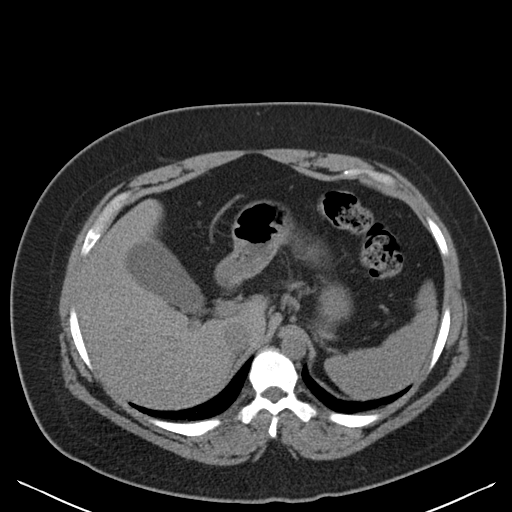
[im 76/93  soft-tissue]
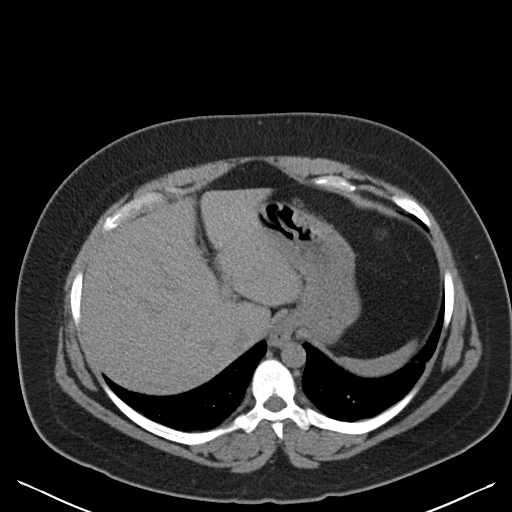
[im 82/93  soft-tissue]
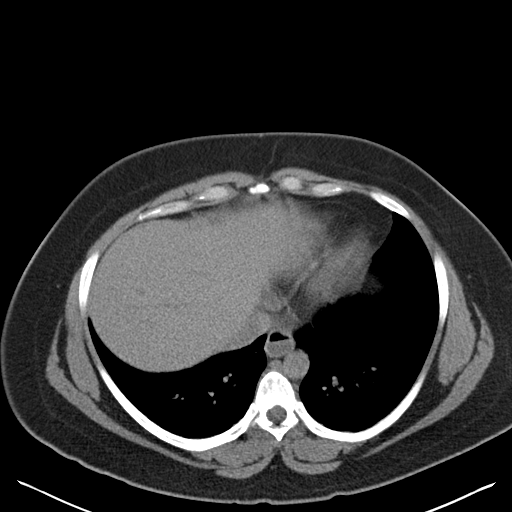
[im 87/93  soft-tissue]
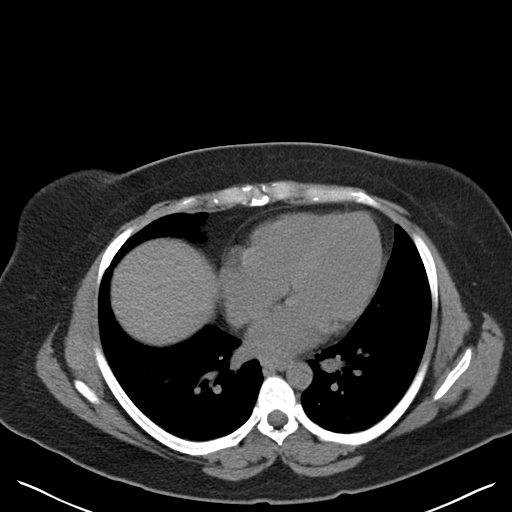

[Series 4: coronal · coronal · 0.97mm/px · 3 of 121 slices shown]
[im 41/121  soft-tissue]
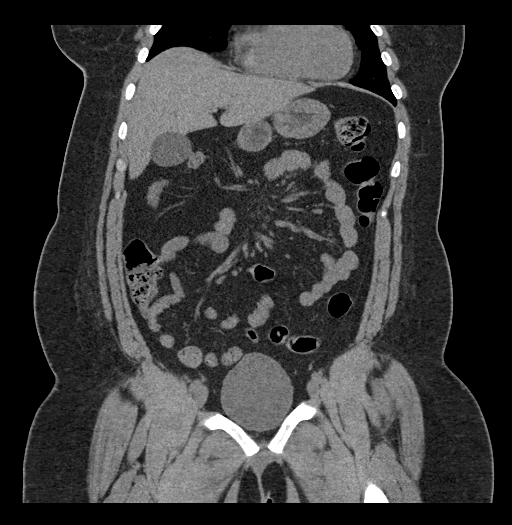
[im 54/121  soft-tissue]
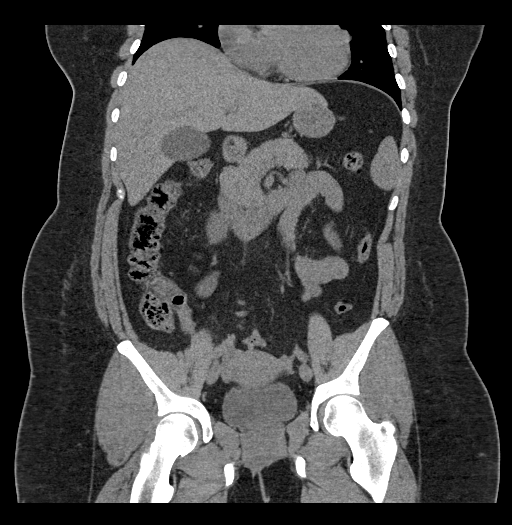
[im 67/121  soft-tissue]
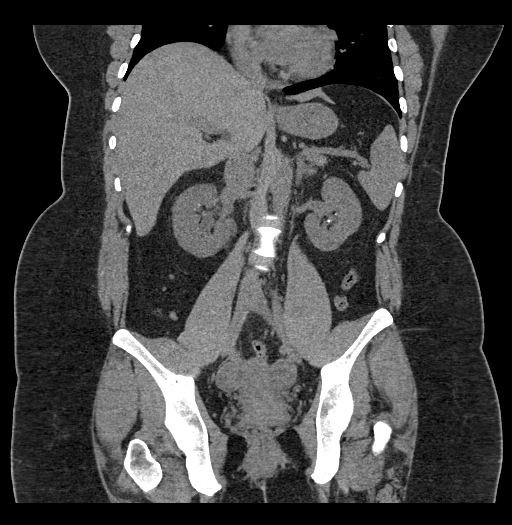

[17 of 46 positions shown; findings below may reference images not displayed]

FINDINGS: Lower chest: Lung bases demonstrate some minimal scarring in the
left lower lobe.

Hepatobiliary: No focal liver abnormality is seen. No gallstones,
gallbladder wall thickening, or biliary dilatation.

Pancreas: Unremarkable. No pancreatic ductal dilatation or
surrounding inflammatory changes.

Spleen: Normal in size without focal abnormality.

Adrenals/Urinary Tract: Adrenal glands are within normal limits.
Tiny nonobstructing left renal stones are seen. No obstructive
changes or ureteral calculi are noted. The bladder is well
distended. Right kidney demonstrates some increase in perinephric
stranding. Multiple renal calculi are again noted. Some mild
fullness of the collecting system is noted secondary to migration of
a right lower pole renal stone into the proximal right ureter. This
stone measures approximately 6 mm in greatest dimension. No more
distal right ureteral stones are seen.

Stomach/Bowel: Stomach is within normal limits. Appendix appears
normal. No evidence of bowel wall thickening, distention, or
inflammatory changes.

Vascular/Lymphatic: No significant vascular findings are present. No
enlarged abdominal or pelvic lymph nodes.

Reproductive: Uterus and bilateral adnexa are unremarkable.

Other: No abdominal wall hernia or abnormality. No abdominopelvic
ascites.

Musculoskeletal: No acute or significant osseous findings.
IMPRESSION: Bilateral nonobstructing renal stones.

Migration of previously seen lower pole right renal stone into the
proximal right ureter. It measures 6 mm with mild hydronephrotic
change.

## 2019-10-10 DIAGNOSIS — K219 Gastro-esophageal reflux disease without esophagitis: Secondary | ICD-10-CM | POA: Insufficient documentation

## 2019-10-10 DIAGNOSIS — F329 Major depressive disorder, single episode, unspecified: Secondary | ICD-10-CM | POA: Insufficient documentation

## 2019-10-14 DIAGNOSIS — R7303 Prediabetes: Secondary | ICD-10-CM | POA: Insufficient documentation

## 2022-02-05 ENCOUNTER — Other Ambulatory Visit (HOSPITAL_COMMUNITY)
Admission: RE | Admit: 2022-02-05 | Discharge: 2022-02-05 | Disposition: A | Discharge status: Home / Self Care | Payer: 59 | Source: Ambulatory Visit | Attending: Obstetrics and Gynecology | Admitting: Obstetrics and Gynecology

## 2022-02-05 ENCOUNTER — Encounter: Payer: Self-pay | Admitting: Obstetrics and Gynecology

## 2022-02-05 ENCOUNTER — Ambulatory Visit: Payer: 59 | Admitting: Obstetrics and Gynecology

## 2022-02-05 VITALS — BP 128/72 | HR 98 | Ht 60.0 in | Wt 220.0 lb

## 2022-02-05 DIAGNOSIS — N914 Secondary oligomenorrhea: Secondary | ICD-10-CM | POA: Diagnosis not present

## 2022-02-05 DIAGNOSIS — Z124 Encounter for screening for malignant neoplasm of cervix: Secondary | ICD-10-CM | POA: Diagnosis not present

## 2022-02-05 DIAGNOSIS — L680 Hirsutism: Secondary | ICD-10-CM

## 2022-02-05 DIAGNOSIS — Z01419 Encounter for gynecological examination (general) (routine) without abnormal findings: Secondary | ICD-10-CM

## 2022-02-05 DIAGNOSIS — Z6841 Body Mass Index (BMI) 40.0 and over, adult: Secondary | ICD-10-CM

## 2022-02-05 DIAGNOSIS — Z23 Encounter for immunization: Secondary | ICD-10-CM | POA: Diagnosis not present

## 2022-02-05 DIAGNOSIS — G44209 Tension-type headache, unspecified, not intractable: Secondary | ICD-10-CM | POA: Insufficient documentation

## 2022-02-05 DIAGNOSIS — N921 Excessive and frequent menstruation with irregular cycle: Secondary | ICD-10-CM

## 2022-02-05 DIAGNOSIS — Z0000 Encounter for general adult medical examination without abnormal findings: Secondary | ICD-10-CM

## 2022-02-05 DIAGNOSIS — F418 Other specified anxiety disorders: Secondary | ICD-10-CM | POA: Insufficient documentation
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
## 2022-02-05 NOTE — Patient Instructions (Signed)

## 2022-02-06 ENCOUNTER — Ambulatory Visit: Payer: 59

## 2022-02-06 DIAGNOSIS — N921 Excessive and frequent menstruation with irregular cycle: Secondary | ICD-10-CM | POA: Diagnosis not present

## 2022-02-06 LAB — CYTOLOGY - PAP
Comment: NEGATIVE
Diagnosis: NEGATIVE
High risk HPV: NEGATIVE

## 2022-02-06 LAB — HEMOGLOBIN A1C
Hgb A1c MFr Bld: 5.7 % of total Hgb — ABNORMAL HIGH (ref <5.7)
Mean Plasma Glucose: 117 mg/dL
eAG (mmol/L): 6.5 mmol/L

## 2022-02-09 LAB — 17-HYDROXYPROGESTERONE: 17-OH-Progesterone, LC/MS/MS: 71 ng/dL

## 2022-02-11 ENCOUNTER — Encounter: Payer: Self-pay | Admitting: Obstetrics and Gynecology

## 2022-02-12 LAB — COMPREHENSIVE METABOLIC PANEL
AG Ratio: 1.4 (calc) (ref 1.0–2.5)
ALT: 51 U/L — ABNORMAL HIGH (ref 6–29)
AST: 26 U/L (ref 10–30)
Albumin: 4.2 g/dL (ref 3.6–5.1)
Alkaline phosphatase (APISO): 87 U/L (ref 31–125)
BUN: 12 mg/dL (ref 7–25)
CO2: 23 mmol/L (ref 20–32)
Calcium: 9.6 mg/dL (ref 8.6–10.2)
Chloride: 105 mmol/L (ref 98–110)
Creat: 0.89 mg/dL (ref 0.50–0.97)
Globulin: 2.9 g/dL (calc) (ref 1.9–3.7)
Glucose, Bld: 92 mg/dL (ref 65–99)
Potassium: 4.4 mmol/L (ref 3.5–5.3)
Sodium: 143 mmol/L (ref 135–146)
Total Bilirubin: 0.3 mg/dL (ref 0.2–1.2)
Total Protein: 7.1 g/dL (ref 6.1–8.1)

## 2022-02-12 LAB — TSH: TSH: 1.62 mIU/L

## 2022-02-12 LAB — FOLLICLE STIMULATING HORMONE: FSH: 7.2 m[IU]/mL

## 2022-02-12 LAB — CBC
HCT: 36.6 % (ref 35.0–45.0)
Hemoglobin: 10.8 g/dL — ABNORMAL LOW (ref 11.7–15.5)
MCH: 21.2 pg — ABNORMAL LOW (ref 27.0–33.0)
MCHC: 29.5 g/dL — ABNORMAL LOW (ref 32.0–36.0)
MCV: 71.8 fL — ABNORMAL LOW (ref 80.0–100.0)
MPV: 8.9 fL (ref 7.5–12.5)
Platelets: 614 10*3/uL — ABNORMAL HIGH (ref 140–400)
RBC: 5.1 10*6/uL (ref 3.80–5.10)
RDW: 17.1 % — ABNORMAL HIGH (ref 11.0–15.0)
WBC: 9 10*3/uL (ref 3.8–10.8)

## 2022-02-12 LAB — LIPID PANEL
Cholesterol: 212 mg/dL — ABNORMAL HIGH (ref <200)
HDL: 46 mg/dL — ABNORMAL LOW (ref >=50)
LDL Cholesterol (Calc): 140 mg/dL (calc) — ABNORMAL HIGH
Non-HDL Cholesterol (Calc): 166 mg/dL (calc) — ABNORMAL HIGH (ref <130)
Total CHOL/HDL Ratio: 4.6 (calc) (ref <5.0)
Triglycerides: 133 mg/dL (ref <150)

## 2022-02-12 LAB — TESTOSTERONE, TOTAL, LC/MS/MS: Testosterone, Total, LC-MS-MS: 29 ng/dL (ref 2–45)

## 2022-02-12 LAB — PROLACTIN: Prolactin: 8.6 ng/mL

## 2022-02-12 LAB — FERRITIN: Ferritin: 20 ng/mL (ref 16–154)

## 2022-02-16 ENCOUNTER — Telehealth: Payer: Self-pay | Admitting: Obstetrics and Gynecology

## 2022-02-16 NOTE — Telephone Encounter (Signed)
Please let the patient know that I have reviewed her ultrasound. She has what appear to be endometrial polyps. I would recommend a hysteroscopy, D&C. We could place a mirena IUD at the same time which would give her endometrial protection.  In addition she had bilateral ovarian cysts which are suggestive of endometriomas. At minimum she needs a follow up ultrasound in 3 months. One of the cysts is 5 cm, that is often the cut off used when considering surgery to treat it. Please make an appointment for her to further discuss the options.

## 2022-02-18 ENCOUNTER — Ambulatory Visit: Payer: 59 | Admitting: Obstetrics and Gynecology

## 2022-02-18 ENCOUNTER — Encounter: Payer: Self-pay | Admitting: Obstetrics and Gynecology

## 2022-02-18 ENCOUNTER — Telehealth: Payer: Self-pay | Admitting: Obstetrics and Gynecology

## 2022-02-18 VITALS — BP 122/80 | HR 66 | Ht 60.0 in | Wt 224.0 lb

## 2022-02-18 DIAGNOSIS — N939 Abnormal uterine and vaginal bleeding, unspecified: Secondary | ICD-10-CM | POA: Diagnosis not present

## 2022-02-18 DIAGNOSIS — N83201 Unspecified ovarian cyst, right side: Secondary | ICD-10-CM | POA: Diagnosis not present

## 2022-02-18 DIAGNOSIS — N83202 Unspecified ovarian cyst, left side: Secondary | ICD-10-CM | POA: Diagnosis not present

## 2022-02-18 DIAGNOSIS — R9389 Abnormal findings on diagnostic imaging of other specified body structures: Secondary | ICD-10-CM | POA: Diagnosis not present

## 2022-03-04 ENCOUNTER — Encounter: Payer: Self-pay | Admitting: Obstetrics and Gynecology

## 2022-03-05 NOTE — Telephone Encounter (Signed)
Please let the patient know that Carmelina Dane tried to reach her and was unable to leave a message. Ask her to call and speak to [facility] in order to schedule her surgery.  Please call in aygestin, 1 tablet po BID until all bleeding stops, then 1 po qd. She should take this until the surgery is done. #30, 1 refill. She is not currently sexually active, but this is not contraception, she would need to use condoms if sexually active.

## 2022-03-06 MED ORDER — NORETHINDRONE ACETATE 5 MG PO TABS: 5.0000 mg | ORAL_TABLET | Freq: Two times a day (BID) | ORAL | 1 refills | Status: DC

## 2022-03-06 NOTE — Telephone Encounter (Signed)
-----   Message from Keenan Bachelor, [place] sent at 03/06/2022  9:25 AM EDT ----- Regarding: scheduling surgery Patient sent My Chart message and mentioned wanting to schedule the surgery.  I told her I will pass message on to you and you will call her when you can.  Thanks

## 2022-03-07 NOTE — Telephone Encounter (Signed)
Call placed to patients mobile, Left message to call Noreene Larsson, RN at [place], [phone].   Call placed to work number per patient request, was advised she was at lunch.

## 2022-03-11 NOTE — Telephone Encounter (Signed)
Spoke with patient, reviewed surgery dates. Patient will review available dates with employer and return call to advise how she would like to proceed.

## 2022-03-12 NOTE — Telephone Encounter (Signed)
Patient left message requesting to proceed with surgery on 04/01/22.   Surgery request sent.   Routing to [employer] for benefits

## 2022-03-13 NOTE — Telephone Encounter (Addendum)
Attempted call to go over surgery benefits. VM full. Front desk aware of surgery payment

## 2022-03-13 NOTE — Telephone Encounter (Signed)
Spoke with patient. Surgery date request confirmed.  Advised surgery is scheduled for 04/01/22, [facility] at 0930.  Surgery instruction sheet and hospital brochure reviewed, printed copy will be mailed.  Patient verbalizes understanding and is agreeable.  Routing to [employer] for benefits.

## 2022-03-14 ENCOUNTER — Other Ambulatory Visit: Payer: Self-pay | Admitting: Obstetrics and Gynecology

## 2022-03-17 ENCOUNTER — Telehealth: Payer: Self-pay | Admitting: *Deleted

## 2022-03-17 NOTE — Telephone Encounter (Signed)
MyChart message to patient notifying of surgery time change.

## 2022-03-19 NOTE — Telephone Encounter (Signed)
Call to patients work number [phone]. Was advised unavailable, Left message to call Noreene Larsson, RN at [place], [phone], OPT 5.

## 2022-03-19 NOTE — Telephone Encounter (Signed)
Spoke with patient. Patient notified of patient responsibility, see account notes.   Patient also made aware of new surgery time and arrival. Patient verbalizes understanding and is agreeable.   Encounter closed.

## 2022-03-21 ENCOUNTER — Encounter (HOSPITAL_BASED_OUTPATIENT_CLINIC_OR_DEPARTMENT_OTHER): Payer: Self-pay | Admitting: Obstetrics and Gynecology

## 2022-03-21 NOTE — Telephone Encounter (Signed)
See telephone encounter dated 02/18/22.  Patient reviewed MyChart message and also notified of surgery time change.  Encounter closed

## 2022-03-26 ENCOUNTER — Encounter: Payer: 59 | Admitting: Obstetrics and Gynecology

## 2022-03-27 ENCOUNTER — Encounter (HOSPITAL_BASED_OUTPATIENT_CLINIC_OR_DEPARTMENT_OTHER): Payer: Self-pay | Admitting: Obstetrics and Gynecology

## 2022-03-27 NOTE — Progress Notes (Signed)
Spoke w/ via phone for pre-op interview--- pt Lab needs dos----  urine preg             Lab results------ no COVID test -----patient states asymptomatic no test needed Arrive at ------- 0630 on 04-01-2022 NPO after MN NO Solid Food.  Clear liquids from MN until---  0530 Med rec completed Medications to take morning of surgery ----- if needed may take buspar  Diabetic medication ----- n/a Patient instructed no nail polish to be worn day of surgery Patient instructed to bring photo id and insurance card day of surgery Patient aware to have Driver (ride ) / caregiver for 24 hours after surgery --- mother, diane Patient Special Instructions ----- n/a Pre-Op special Istructions ----- n/a Patient verbalized understanding of instructions that were given at this phone interview. Patient denies shortness of breath, chest pain, fever, cough at this phone interview.

## 2022-03-31 ENCOUNTER — Ambulatory Visit (INDEPENDENT_AMBULATORY_CARE_PROVIDER_SITE_OTHER): Payer: 59 | Admitting: Obstetrics and Gynecology

## 2022-03-31 ENCOUNTER — Encounter: Payer: Self-pay | Admitting: Obstetrics and Gynecology

## 2022-03-31 VITALS — BP 130/72 | HR 87 | Ht 61.0 in | Wt 228.0 lb

## 2022-03-31 DIAGNOSIS — N83201 Unspecified ovarian cyst, right side: Secondary | ICD-10-CM | POA: Diagnosis not present

## 2022-03-31 DIAGNOSIS — N83202 Unspecified ovarian cyst, left side: Secondary | ICD-10-CM

## 2022-03-31 DIAGNOSIS — R9389 Abnormal findings on diagnostic imaging of other specified body structures: Secondary | ICD-10-CM | POA: Diagnosis not present

## 2022-03-31 DIAGNOSIS — N939 Abnormal uterine and vaginal bleeding, unspecified: Secondary | ICD-10-CM
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
## 2022-03-31 NOTE — Anesthesia Preprocedure Evaluation (Signed)
Anesthesia Evaluation  Patient identified by MRN, date of birth, ID band Patient awake    Reviewed: Allergy & Precautions, NPO status , Patient's Chart, lab work & pertinent test results  Airway Mallampati: II  TM Distance: >3 FB Neck ROM: Full    Dental no notable dental hx. (+) Teeth Intact, Dental Advisory Given   Pulmonary neg pulmonary ROS,    Pulmonary exam normal breath sounds clear to auscultation       Cardiovascular Exercise Tolerance: Good Normal cardiovascular exam Rhythm:Regular Rate:Normal     Neuro/Psych  Headaches, PSYCHIATRIC DISORDERS Anxiety Depression    GI/Hepatic GERD  ,  Endo/Other  Morbid obesity (BMI 43.1)  Renal/GU      Musculoskeletal   Abdominal (+) + obese,   Peds  Hematology  (+) Blood dyscrasia, anemia , Lab Results      Component                Value               Date                      WBC                      9.0                 02/05/2022                HGB                      10.8 (L)            02/05/2022                HCT                      36.6                02/05/2022                MCV                      71.8 (L)            02/05/2022                PLT                      614 (H)             02/05/2022              Anesthesia Other Findings   Reproductive/Obstetrics negative OB ROS                            Anesthesia Physical Anesthesia Plan  ASA: 3  Anesthesia Plan: General   Post-op Pain Management: Toradol IV (intra-op)*, Tylenol PO (pre-op)*, Minimal or no pain anticipated and Precedex   Induction: Intravenous  PONV Risk Score and Plan: Treatment may vary due to age or medical condition, Midazolam, Ondansetron and Dexamethasone  Airway Management Planned: LMA  Additional Equipment: None  Intra-op Plan:   Post-operative Plan:   Informed Consent: I have reviewed the patients History and Physical, chart, labs and  discussed the procedure including the risks, benefits and alternatives for the proposed anesthesia with the patient or authorized representative  who has indicated his/her understanding and acceptance.     Dental advisory given  Plan Discussed with: CRNA  Anesthesia Plan Comments:        Anesthesia Quick Evaluation

## 2022-04-01 ENCOUNTER — Other Ambulatory Visit: Payer: Self-pay

## 2022-04-01 ENCOUNTER — Ambulatory Visit (HOSPITAL_BASED_OUTPATIENT_CLINIC_OR_DEPARTMENT_OTHER): Payer: 59 | Admitting: Anesthesiology

## 2022-04-01 ENCOUNTER — Encounter (HOSPITAL_BASED_OUTPATIENT_CLINIC_OR_DEPARTMENT_OTHER): Payer: Self-pay | Admitting: Obstetrics and Gynecology

## 2022-04-01 ENCOUNTER — Encounter (HOSPITAL_BASED_OUTPATIENT_CLINIC_OR_DEPARTMENT_OTHER)
Admission: RE | Disposition: A | Discharge status: Home / Self Care | Payer: Self-pay | Source: Home / Self Care | Attending: Obstetrics and Gynecology

## 2022-04-01 ENCOUNTER — Ambulatory Visit (HOSPITAL_BASED_OUTPATIENT_CLINIC_OR_DEPARTMENT_OTHER)
Admission: RE | Admit: 2022-04-01 | Discharge: 2022-04-01 | Disposition: A | Discharge status: Home / Self Care | Payer: 59 | Attending: Obstetrics and Gynecology | Admitting: Obstetrics and Gynecology

## 2022-04-01 DIAGNOSIS — N719 Inflammatory disease of uterus, unspecified: Secondary | ICD-10-CM | POA: Diagnosis not present

## 2022-04-01 DIAGNOSIS — Z01818 Encounter for other preprocedural examination: Secondary | ICD-10-CM

## 2022-04-01 DIAGNOSIS — N921 Excessive and frequent menstruation with irregular cycle: Secondary | ICD-10-CM

## 2022-04-01 DIAGNOSIS — N711 Chronic inflammatory disease of uterus: Secondary | ICD-10-CM | POA: Insufficient documentation

## 2022-04-01 DIAGNOSIS — F418 Other specified anxiety disorders: Secondary | ICD-10-CM

## 2022-04-01 DIAGNOSIS — Z6841 Body Mass Index (BMI) 40.0 and over, adult: Secondary | ICD-10-CM | POA: Diagnosis not present

## 2022-04-01 HISTORY — PX: INTRAUTERINE DEVICE (IUD) INSERTION: SHX5877

## 2022-04-01 HISTORY — DX: Generalized anxiety disorder: F41.1

## 2022-04-01 HISTORY — PX: DILATATION & CURETTAGE/HYSTEROSCOPY WITH MYOSURE: SHX6511

## 2022-04-01 HISTORY — DX: Prediabetes: R73.03

## 2022-04-01 HISTORY — DX: Anemia, unspecified: D64.9

## 2022-04-01 HISTORY — DX: Abnormal findings on diagnostic imaging of other specified body structures: R93.89

## 2022-04-01 HISTORY — DX: Excessive and frequent menstruation with irregular cycle: N92.1

## 2022-04-01 LAB — POCT PREGNANCY, URINE: Preg Test, Ur: NEGATIVE

## 2022-04-01 SURGERY — DILATATION & CURETTAGE/HYSTEROSCOPY WITH MYOSURE
Anesthesia: General | Site: Uterus

## 2022-04-01 MED ORDER — OXYCODONE HCL 5 MG/5ML PO SOLN: 5.0000 mg | Freq: Once | ORAL | Status: DC | PRN

## 2022-04-01 MED ORDER — MIDAZOLAM HCL 2 MG/2ML IJ SOLN
INTRAMUSCULAR | Status: AC
  Filled 2022-04-01: qty 2

## 2022-04-01 MED ORDER — OXYCODONE HCL 5 MG PO TABS
5.0000 mg | ORAL_TABLET | Freq: Once | ORAL | Status: AC | PRN
  Administered 2022-04-01: 5 mg via ORAL

## 2022-04-01 MED ORDER — MIDAZOLAM HCL 5 MG/5ML IJ SOLN
INTRAMUSCULAR | Status: DC | PRN
  Administered 2022-04-01: 2 mg via INTRAVENOUS

## 2022-04-01 MED ORDER — ONDANSETRON HCL 4 MG/2ML IJ SOLN: 4.0000 mg | Freq: Once | INTRAMUSCULAR | Status: DC | PRN

## 2022-04-01 MED ORDER — KETOROLAC TROMETHAMINE 30 MG/ML IJ SOLN
INTRAMUSCULAR | Status: DC | PRN
  Administered 2022-04-01: 30 mg via INTRAVENOUS

## 2022-04-01 MED ORDER — OXYCODONE HCL 5 MG/5ML PO SOLN: 5.0000 mg | Freq: Once | ORAL | Status: AC | PRN

## 2022-04-01 MED ORDER — DEXAMETHASONE SODIUM PHOSPHATE 10 MG/ML IJ SOLN
INTRAMUSCULAR | Status: AC
  Filled 2022-04-01: qty 1

## 2022-04-01 MED ORDER — HYDROMORPHONE HCL 1 MG/ML IJ SOLN
INTRAMUSCULAR | Status: AC
  Filled 2022-04-01: qty 1

## 2022-04-01 MED ORDER — LIDOCAINE HCL (PF) 2 % IJ SOLN
INTRAMUSCULAR | Status: AC
  Filled 2022-04-01: qty 5

## 2022-04-01 MED ORDER — PROPOFOL 10 MG/ML IV BOLUS
INTRAVENOUS | Status: DC | PRN
  Administered 2022-04-01: 200 mg via INTRAVENOUS
  Administered 2022-04-01: 30 mg via INTRAVENOUS

## 2022-04-01 MED ORDER — DEXAMETHASONE SODIUM PHOSPHATE 10 MG/ML IJ SOLN
INTRAMUSCULAR | Status: DC | PRN
  Administered 2022-04-01: 5 mg via INTRAVENOUS

## 2022-04-01 MED ORDER — DEXMEDETOMIDINE HCL IN NACL 80 MCG/20ML IV SOLN
INTRAVENOUS | Status: AC
  Filled 2022-04-01: qty 20

## 2022-04-01 MED ORDER — KETOROLAC TROMETHAMINE 30 MG/ML IJ SOLN: 30.0000 mg | Freq: Once | INTRAMUSCULAR | Status: DC | PRN

## 2022-04-01 MED ORDER — PROPOFOL 10 MG/ML IV BOLUS
INTRAVENOUS | Status: AC
  Filled 2022-04-01: qty 20

## 2022-04-01 MED ORDER — ACETAMINOPHEN 500 MG PO TABS
ORAL_TABLET | ORAL | Status: AC
  Filled 2022-04-01: qty 2

## 2022-04-01 MED ORDER — FENTANYL CITRATE (PF) 100 MCG/2ML IJ SOLN
INTRAMUSCULAR | Status: DC | PRN
  Administered 2022-04-01 (×4): 50 ug via INTRAVENOUS

## 2022-04-01 MED ORDER — LEVONORGESTREL 20 MCG/DAY IU IUD
1.0000 | INTRAUTERINE_SYSTEM | INTRAUTERINE | Status: AC
  Administered 2022-04-01: 1 via INTRAUTERINE

## 2022-04-01 MED ORDER — LIDOCAINE 2% (20 MG/ML) 5 ML SYRINGE
INTRAMUSCULAR | Status: DC | PRN
  Administered 2022-04-01: 100 mg via INTRAVENOUS

## 2022-04-01 MED ORDER — HYDROMORPHONE HCL 1 MG/ML IJ SOLN
0.2500 mg | INTRAMUSCULAR | Status: DC | PRN
  Administered 2022-04-01: 0.25 mg via INTRAVENOUS

## 2022-04-01 MED ORDER — OXYCODONE HCL 5 MG PO TABS: 5.0000 mg | ORAL_TABLET | Freq: Once | ORAL | Status: DC | PRN

## 2022-04-01 MED ORDER — KETOROLAC TROMETHAMINE 30 MG/ML IJ SOLN
INTRAMUSCULAR | Status: AC
  Filled 2022-04-01: qty 1

## 2022-04-01 MED ORDER — FENTANYL CITRATE (PF) 100 MCG/2ML IJ SOLN
INTRAMUSCULAR | Status: AC
  Filled 2022-04-01: qty 2

## 2022-04-01 MED ORDER — LACTATED RINGERS IV SOLN
INTRAVENOUS | Status: DC
Start: 2022-04-01 — End: 2022-04-01

## 2022-04-01 MED ORDER — OXYCODONE HCL 5 MG PO TABS
ORAL_TABLET | ORAL | Status: AC
  Filled 2022-04-01: qty 1

## 2022-04-01 MED ORDER — HYDROMORPHONE HCL 1 MG/ML IJ SOLN: 0.2500 mg | INTRAMUSCULAR | Status: DC | PRN

## 2022-04-01 MED ORDER — SODIUM CHLORIDE 0.9 % IR SOLN
Status: DC | PRN
  Administered 2022-04-01: 3000 mL

## 2022-04-01 MED ORDER — ONDANSETRON HCL 4 MG/2ML IJ SOLN
INTRAMUSCULAR | Status: DC | PRN
  Administered 2022-04-01: 4 mg via INTRAVENOUS

## 2022-04-01 MED ORDER — ACETAMINOPHEN 500 MG PO TABS
1000.0000 mg | ORAL_TABLET | ORAL | Status: AC
  Administered 2022-04-01: 1000 mg via ORAL

## 2022-04-01 MED ORDER — ONDANSETRON HCL 4 MG/2ML IJ SOLN
INTRAMUSCULAR | Status: AC
  Filled 2022-04-01: qty 2

## 2022-04-01 SURGICAL SUPPLY — 26 items
CATH ROBINSON RED A/P 16FR (CATHETERS) IMPLANT
DEVICE MYOSURE LITE (MISCELLANEOUS) IMPLANT
DEVICE MYOSURE REACH (MISCELLANEOUS) ×1 IMPLANT
DILATOR CANAL MILEX (MISCELLANEOUS) ×1 IMPLANT
DRSG TELFA 3X8 NADH (GAUZE/BANDAGES/DRESSINGS) ×3 IMPLANT
GAUZE 4X4 16PLY ~~LOC~~+RFID DBL (SPONGE) ×6 IMPLANT
GLOVE BIO SURGEON STRL SZ 6.5 (GLOVE) ×4 IMPLANT
GLOVE BIOGEL PI IND STRL 7.5 (GLOVE) IMPLANT
GLOVE BIOGEL PI INDICATOR 7.5 (GLOVE) ×1
GOWN STRL REUS W/TWL LRG LVL3 (GOWN DISPOSABLE) ×3 IMPLANT
IV NS IRRIG 3000ML ARTHROMATIC (IV SOLUTION) ×3 IMPLANT
KIT PROCEDURE FLUENT (KITS) ×3 IMPLANT
KIT TURNOVER CYSTO (KITS) ×3 IMPLANT
MIRENA IUD ×1 IMPLANT
MYOSURE XL FIBROID (MISCELLANEOUS)
PACK VAGINAL MINOR WOMEN LF (CUSTOM PROCEDURE TRAY) ×3 IMPLANT
PAD DRESSING TELFA 3X8 NADH (GAUZE/BANDAGES/DRESSINGS) ×2 IMPLANT
PAD OB MATERNITY 4.3X12.25 (PERSONAL CARE ITEMS) ×3 IMPLANT
PAD PREP 24X48 CUFFED NSTRL (MISCELLANEOUS) ×3 IMPLANT
SEAL CERVICAL OMNI LOK (ABLATOR) IMPLANT
SEAL ROD LENS SCOPE MYOSURE (ABLATOR) ×3 IMPLANT
SWAB OB GYN 8IN STERILE 2PK (MISCELLANEOUS) ×3 IMPLANT
SYR 20ML LL LF (SYRINGE) IMPLANT
SYSTEM TISS REMOVAL MYOSURE XL (MISCELLANEOUS) IMPLANT
TOWEL OR 17X26 10 PK STRL BLUE (TOWEL DISPOSABLE) ×6 IMPLANT
WATER STERILE IRR 500ML POUR (IV SOLUTION) IMPLANT

## 2022-04-01 NOTE — Anesthesia Procedure Notes (Deleted)
Procedure Name: LMA Insertion Date/Time: 04/01/2022 8:47 AM  Performed by: Bishop Limbo, CRNAPre-anesthesia Checklist: Patient identified, Emergency Drugs available, Suction available and Patient being monitored Patient Re-evaluated:Patient Re-evaluated prior to induction Oxygen Delivery Method: Circle System Utilized Preoxygenation: Pre-oxygenation with 100% oxygen Induction Type: IV induction Ventilation: Mask ventilation without difficulty LMA: LMA inserted LMA Size: 4.0 Number of attempts: 1 Placement Confirmation: positive ETCO2 Tube secured with: Tape Dental Injury: Teeth and Oropharynx as per pre-operative assessment

## 2022-04-01 NOTE — Op Note (Signed)
Preoperative Diagnosis: menometrorrhagia  Postoperative Diagnosis: same  Procedure: Hysteroscopy, dilation and curettage, mirena intrauterine device insertion  Surgeon: Dr Gertie Exon  Assistants: None  Anesthesia: General via LMA  EBL: 5 cc  Fluids: 600 cc  Fluid deficit: 185 cc  Urine output: not recorded  Indications for surgery: The patient is a 37 yo female, who presented with menometrorrhagia. Work up included a normal pap, Hgb of 10.8, TSH 1.62 and an ultrasound with a thickened endometrium concerning for an endometrial polyp as well as bilateral ovarian cysts.  The risks of the surgery were reviewed with the patient and the consent form was signed prior to her surgery. Her ovarian cysts will be followed clinically at this time.   Findings: thickened endometrium, normal tubal ostia bilaterally  Specimens: endometrial curettings    Procedure: The patient was taken to the operating room with an IV in place. She was placed in the dorsal lithotomy position and anesthesia was administered. She was prepped and draped in the usual sterile fashion for a vaginal procedure. She voided on the way to the OR. A weighted speculum was placed in the vagina and a single tooth tenaculum was placed on the anterior lip of the cervix. The cervix was dilated to a #6.5 hagar dilator. The uterus was sounded to 7-8 cm cm. The myosure hysteroscope was inserted into the uterine cavity. With continuous infusion of normal saline, the uterine cavity was visualized with the above findings. The myosure reach was used to resect the thickened endometrium. The myosure was then removed. The cavity was then curetted with the small sharp curette. The cavity had the characteristically gritty texture at the end of the procedure. The curette was removed and the mirena IUD was inserted in the standard fashion. The strings were cut to 3 cm. The single tooth tenaculum was removed. The speculum was removed. The patients  perineum was cleansed of betadine and she was taken out of the dorsal lithotomy position.  Upon awakening the the LMA was removed and the patient was transferred to the recovery room in stable and awake condition.  The sponge and instrument count were correct. There were no complications.

## 2022-04-01 NOTE — Anesthesia Procedure Notes (Signed)
Procedure Name: LMA Insertion Date/Time: 04/01/2022 8:42 AM  Performed by: Bishop Limbo, CRNAPre-anesthesia Checklist: Patient identified, Emergency Drugs available, Suction available and Patient being monitored Patient Re-evaluated:Patient Re-evaluated prior to induction Oxygen Delivery Method: Circle System Utilized Preoxygenation: Pre-oxygenation with 100% oxygen Induction Type: IV induction Ventilation: Mask ventilation without difficulty LMA: LMA inserted LMA Size: 4.0 Number of attempts: 1 Airway Equipment and Method: Bite block Placement Confirmation: positive ETCO2 Tube secured with: Tape Dental Injury: Teeth and Oropharynx as per pre-operative assessment

## 2022-04-01 NOTE — Interval H&P Note (Signed)
History and Physical Interval Note:  04/01/2022 8:20 AM  Sarah Fuller  has presented today for surgery, with the diagnosis of menometrorrhagia, thickened endometrium.  The various methods of treatment have been discussed with the patient and family. After consideration of risks, benefits and other options for treatment, the patient has consented to  Procedure(s): DILATATION & CURETTAGE/HYSTEROSCOPY WITH MYOSURE (N/A) INTRAUTERINE DEVICE (IUD) INSERTION (N/A) as a surgical intervention.  The patient's history has been reviewed, patient examined, no change in status, stable for surgery.  I have reviewed the patient's chart and labs.  Questions were answered to the patient's satisfaction.     Romualdo Bolk

## 2022-04-01 NOTE — Transfer of Care (Signed)
Immediate Anesthesia Transfer of Care Note  Patient: Sarah Fuller  Procedure(s) Performed: DILATATION & CURETTAGE/HYSTEROSCOPY WITH MYOSURE (Uterus) INTRAUTERINE DEVICE (IUD) INSERTION (Cervix)  Patient Location: PACU  Anesthesia Type:General  Level of Consciousness: awake, alert , oriented and patient cooperative  Airway & Oxygen Therapy: Patient Spontanous Breathing  Post-op Assessment: Report given to RN and Post -op Vital signs reviewed and stable  Post vital signs: Reviewed and stable  Last Vitals:  Vitals Value Taken Time  BP 126/86 04/01/22 0934  Temp    Pulse 85 04/01/22 0939  Resp 20 04/01/22 0939  SpO2 93 % 04/01/22 0939  Vitals shown include unvalidated device data.  Last Pain:  Vitals:   04/01/22 0658  TempSrc: Oral  PainSc: 0-No pain      Patients Stated Pain Goal: 3 (04/01/22 5456)  Complications: No notable events documented.

## 2022-04-01 NOTE — Discharge Instructions (Signed)
NO TYLENOL PRODUCTS UNTIL 1:02PM NO IBUPROFEN PRODUCTS UNTIL 3:20PM   Post Anesthesia Home Care Instructions  Activity: Get plenty of rest for the remainder of the day. A responsible individual must stay with you for 24 hours following the procedure.  For the next 24 hours, DO NOT: -Drive a car -Advertising copywriter -Drink alcoholic beverages -Take any medication unless instructed by your physician -Make any legal decisions or sign important papers.  Meals: Start with liquid foods such as gelatin or soup. Progress to regular foods as tolerated. Avoid greasy, spicy, heavy foods. If nausea and/or vomiting occur, drink only clear liquids until the nausea and/or vomiting subsides. Call your physician if vomiting continues.  Special Instructions/Symptoms: Your throat may feel dry or sore from the anesthesia or the breathing tube placed in your throat during surgery. If this causes discomfort, gargle with warm salt water. The discomfort should disappear within 24 hours.

## 2022-04-01 NOTE — Anesthesia Postprocedure Evaluation (Signed)
Anesthesia Post Note  Patient: ALARA DANIEL  Procedure(s) Performed: DILATATION & CURETTAGE/HYSTEROSCOPY WITH MYOSURE (Uterus) INTRAUTERINE DEVICE (IUD) INSERTION (Cervix)     Patient location during evaluation: PACU Anesthesia Type: General Level of consciousness: awake and alert Pain management: pain level controlled Vital Signs Assessment: post-procedure vital signs reviewed and stable Respiratory status: spontaneous breathing, nonlabored ventilation, respiratory function stable and patient connected to nasal cannula oxygen Cardiovascular status: blood pressure returned to baseline and stable Postop Assessment: no apparent nausea or vomiting Anesthetic complications: no   No notable events documented.  Last Vitals:  Vitals:   04/01/22 1025 04/01/22 1045  BP: (!) 136/57 121/87  Pulse: 81 73  Resp: 20 14  Temp: (!) 36.4 C 37.1 C  SpO2: 95% 97%    Last Pain:  Vitals:   04/01/22 1045  TempSrc:   PainSc: 2                  Trevor Iha

## 2022-04-02 LAB — SURGICAL PATHOLOGY

## 2022-04-03 ENCOUNTER — Encounter (HOSPITAL_BASED_OUTPATIENT_CLINIC_OR_DEPARTMENT_OTHER): Payer: Self-pay | Admitting: Obstetrics and Gynecology

## 2022-04-03 ENCOUNTER — Ambulatory Visit: Payer: 59

## 2022-04-04 ENCOUNTER — Other Ambulatory Visit: Payer: Self-pay | Admitting: *Deleted

## 2022-04-04 MED ORDER — DOXYCYCLINE HYCLATE 100 MG PO TABS
100.0000 mg | ORAL_TABLET | Freq: Two times a day (BID) | ORAL | 0 refills | Status: AC
Start: 2022-04-04 — End: ?

## 2022-04-11 ENCOUNTER — Other Ambulatory Visit: Payer: Self-pay | Admitting: Obstetrics and Gynecology

## 2022-04-11 NOTE — Telephone Encounter (Signed)
Patient has annual exam in 01/2022 D&C on 04/01/22

## 2022-04-14 ENCOUNTER — Ambulatory Visit (INDEPENDENT_AMBULATORY_CARE_PROVIDER_SITE_OTHER): Payer: 59 | Admitting: Obstetrics and Gynecology

## 2022-04-14 ENCOUNTER — Encounter: Payer: Self-pay | Admitting: Obstetrics and Gynecology

## 2022-04-14 VITALS — BP 130/80 | HR 73 | Ht 60.0 in | Wt 227.0 lb

## 2022-04-14 DIAGNOSIS — Z9889 Other specified postprocedural states: Secondary | ICD-10-CM

## 2022-04-14 DIAGNOSIS — R350 Frequency of micturition: Secondary | ICD-10-CM

## 2022-04-14 DIAGNOSIS — Z30431 Encounter for routine checking of intrauterine contraceptive device: Secondary | ICD-10-CM | POA: Diagnosis not present

## 2022-04-14 DIAGNOSIS — Z23 Encounter for immunization: Secondary | ICD-10-CM | POA: Diagnosis not present

## 2022-04-14 DIAGNOSIS — Z8742 Personal history of other diseases of the female genital tract: Secondary | ICD-10-CM | POA: Diagnosis not present

## 2022-04-14 LAB — URINALYSIS, COMPLETE
Bilirubin Urine: NEGATIVE
Glucose, UA: NEGATIVE
Hyaline Cast: NONE SEEN /LPF
Nitrite: NEGATIVE
RBC / HPF: >=60 /HPF — AB (ref 0–2)
Specific Gravity, Urine: 1.02 (ref 1.001–1.035)
pH: 5.5 (ref 5.0–8.0)
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
## 2022-04-15 LAB — SURESWAB CT/NG/T. VAGINALIS
C. trachomatis RNA, TMA: NOT DETECTED
N. gonorrhoeae RNA, TMA: NOT DETECTED
Trichomonas vaginalis RNA: NOT DETECTED

## 2022-04-15 LAB — URINE CULTURE
MICRO NUMBER:: 13807706
SPECIMEN QUALITY:: ADEQUATE

## 2022-05-19 ENCOUNTER — Other Ambulatory Visit: Payer: Self-pay | Admitting: *Deleted

## 2022-05-19 DIAGNOSIS — N83201 Unspecified ovarian cyst, right side: Secondary | ICD-10-CM

## 2022-05-20 ENCOUNTER — Other Ambulatory Visit: Payer: 59 | Admitting: Obstetrics and Gynecology

## 2022-05-20 ENCOUNTER — Other Ambulatory Visit: Payer: 59

## 2022-06-26 ENCOUNTER — Other Ambulatory Visit: Payer: 59

## 2022-06-26 ENCOUNTER — Other Ambulatory Visit: Payer: 59 | Admitting: Obstetrics and Gynecology

## 2022-06-26 DIAGNOSIS — Z0289 Encounter for other administrative examinations: Secondary | ICD-10-CM
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
## 2022-07-08 ENCOUNTER — Encounter: Payer: Self-pay | Admitting: Obstetrics and Gynecology

## 2022-07-14 ENCOUNTER — Telehealth: Payer: Self-pay | Admitting: Obstetrics and Gynecology

## 2022-07-14 NOTE — Telephone Encounter (Signed)
Patient no show for her ultrasound appointment on November 2. Message was left on November 6 and 10 and letter mailed on November 14 for patient to call and reschedule her missed appointment. Patient has not rescheduled.

## 2022-07-23 NOTE — Telephone Encounter (Signed)
Left message to call Noreene Larsson, RN at [facility], [phone].   PUS -f/u bilateral complex ovarian cysts/ JJ

## 2022-08-04 NOTE — Telephone Encounter (Signed)
Left message to call Seren Chaloux, RN at [facility], [phone], [facility].   MyChart message to patient.  

## 2022-08-05 NOTE — Telephone Encounter (Signed)
No response from patient.   Letter pended, copy to Dr. Oscar La to review and sign.

## 2022-08-14 NOTE — Telephone Encounter (Signed)
Letter reviewed and signed by Dr. Jertson.  Mailed to address on file.   Encounter closed.
